# Patient Record
Sex: Female | Born: 1975 | Race: White | Hispanic: No | Marital: Married | Smoking: Current every day smoker
Health system: Southern US, Community
[De-identification: ages and names within clinical notes are randomized; demographics above are authoritative.]

## PROBLEM LIST (undated history)

## (undated) DIAGNOSIS — M797 Fibromyalgia: Secondary | ICD-10-CM

## (undated) DIAGNOSIS — M4316 Spondylolisthesis, lumbar region: Secondary | ICD-10-CM

## (undated) DIAGNOSIS — J45909 Unspecified asthma, uncomplicated: Secondary | ICD-10-CM

## (undated) DIAGNOSIS — M545 Low back pain, unspecified: Secondary | ICD-10-CM

## (undated) DIAGNOSIS — B182 Chronic viral hepatitis C: Secondary | ICD-10-CM

## (undated) DIAGNOSIS — IMO0002 Reserved for concepts with insufficient information to code with codable children: Secondary | ICD-10-CM

## (undated) HISTORY — PX: TUBAL LIGATION: SHX77

---

## 2000-10-01 ENCOUNTER — Emergency Department (HOSPITAL_COMMUNITY): Admission: EM | Admit: 2000-10-01 | Discharge: 2000-10-02 | Payer: Self-pay | Admitting: Emergency Medicine

## 2003-08-26 ENCOUNTER — Emergency Department (HOSPITAL_COMMUNITY): Admission: AD | Admit: 2003-08-26 | Discharge: 2003-08-26 | Payer: Self-pay | Admitting: Family Medicine

## 2004-05-24 ENCOUNTER — Emergency Department (HOSPITAL_COMMUNITY): Admission: EM | Admit: 2004-05-24 | Discharge: 2004-05-24 | Payer: Self-pay | Admitting: Emergency Medicine

## 2005-08-16 ENCOUNTER — Emergency Department (HOSPITAL_COMMUNITY): Admission: EM | Admit: 2005-08-16 | Discharge: 2005-08-16 | Payer: Self-pay | Admitting: Family Medicine

## 2005-11-03 ENCOUNTER — Ambulatory Visit (HOSPITAL_COMMUNITY): Admission: RE | Admit: 2005-11-03 | Discharge: 2005-11-03 | Payer: Self-pay | Admitting: Obstetrics

## 2008-08-24 ENCOUNTER — Emergency Department (HOSPITAL_COMMUNITY): Admission: EM | Admit: 2008-08-24 | Discharge: 2008-08-24 | Payer: Self-pay | Admitting: Family Medicine

## 2009-02-11 ENCOUNTER — Emergency Department (HOSPITAL_COMMUNITY): Admission: EM | Admit: 2009-02-11 | Discharge: 2009-02-11 | Payer: Self-pay | Admitting: Family Medicine

## 2009-03-08 ENCOUNTER — Emergency Department (HOSPITAL_COMMUNITY): Admission: EM | Admit: 2009-03-08 | Discharge: 2009-03-08 | Payer: Self-pay | Admitting: Family Medicine

## 2009-03-15 ENCOUNTER — Emergency Department (HOSPITAL_COMMUNITY): Admission: EM | Admit: 2009-03-15 | Discharge: 2009-03-15 | Payer: Self-pay | Admitting: Emergency Medicine

## 2009-03-17 ENCOUNTER — Encounter
Admission: RE | Admit: 2009-03-17 | Discharge: 2009-03-17 | Payer: Self-pay | Admitting: Physical Medicine and Rehabilitation

## 2009-05-13 ENCOUNTER — Emergency Department (HOSPITAL_COMMUNITY): Admission: EM | Admit: 2009-05-13 | Discharge: 2009-05-13 | Payer: Self-pay | Admitting: Emergency Medicine

## 2010-02-19 ENCOUNTER — Ambulatory Visit: Payer: Self-pay | Admitting: Gastroenterology

## 2010-04-08 ENCOUNTER — Ambulatory Visit (HOSPITAL_COMMUNITY): Admission: RE | Admit: 2010-04-08 | Discharge: 2010-04-08 | Payer: Self-pay | Admitting: Gastroenterology

## 2010-10-29 LAB — CBC
HCT: 37 % (ref 36.0–46.0)
MCHC: 34.1 g/dL (ref 30.0–36.0)
MCV: 92.3 fL (ref 78.0–100.0)
Platelets: 203 10*3/uL (ref 150–400)
RDW: 13.6 % (ref 11.5–15.5)
WBC: 9.6 10*3/uL (ref 4.0–10.5)

## 2010-10-29 LAB — PROTIME-INR
INR: 1.04 (ref 0.00–1.49)
Prothrombin Time: 13.8 seconds (ref 11.6–15.2)

## 2010-11-22 LAB — URINALYSIS, ROUTINE W REFLEX MICROSCOPIC
Glucose, UA: >1000 mg/dL — AB
Hgb urine dipstick: NEGATIVE
Leukocytes, UA: NEGATIVE
Protein, ur: NEGATIVE mg/dL
Specific Gravity, Urine: 1.028 (ref 1.005–1.030)

## 2010-11-22 LAB — POCT PREGNANCY, URINE: Preg Test, Ur: NEGATIVE

## 2011-01-01 NOTE — Op Note (Signed)
NAME:  Cathy Santos, Cathy Santos                 ACCOUNT NO.:  [id number]   MEDICAL RECORD NO.:  [id number]          PATIENT TYPE:  AMB   LOCATION:  SDC                           FACILITY:  [facility]   PHYSICIAN:  Kathreen Cosier, M.D.DATE OF BIRTH:  Apr 16, 1976   DATE OF PROCEDURE:  11/03/2005  DATE OF DISCHARGE:                                 OPERATIVE REPORT   PREOPERATIVE DIAGNOSIS:  Multiparity.   OPERATION/PROCEDURE:  Open laparoscopic tubal sterilization.   DESCRIPTION OF PROCEDURE:  Under general anesthesia, the patient in the  lithotomy position, abdomen was prepped and draped. Bladder emptied with  straight catheter.  Speculum placed in the vagina and Hulka tenaculum was  placed in the cervix.  In the umbilicus, transverse incision made and  carried down to the fascia.  The fascia was cleaned and grasped with two  Kochers.  The fascia and peritoneum were opened with Mayo scissors.  The  sleeve of the trocar inserted intraperitoneally and  3 L of carbon dioxide  infused intraperitoneally visualizing the scope inserted.  Uterus, tubes and  ovaries were normal.  Cautery probe inserted through the sleeve of the  scope.  Right tube grasped one inch from the cornua, cauterized.  The tube  was cauterized in a total of  four places moving laterally from the first  site of cautery.  This was done in the similar fashion on the other side.  Probe was removed.  CO2 was allowed to escape from the peritoneal cavity.  Fascia closed with one  suture of 0 Dexon.  Skin closed with subcuticular  stitch of 4-0 Monocryl.           ______________________________  Kathreen Cosier, M.D.     BAM/MEDQ  D:  11/03/2005  T:  11/04/2005  Job:  [id number]

## 2011-01-10 ENCOUNTER — Inpatient Hospital Stay (INDEPENDENT_AMBULATORY_CARE_PROVIDER_SITE_OTHER)
Admission: RE | Admit: 2011-01-10 | Discharge: 2011-01-10 | Disposition: A | Discharge status: Home / Self Care | Payer: Medicaid Other | Source: Ambulatory Visit | Attending: Emergency Medicine | Admitting: Emergency Medicine

## 2011-01-10 DIAGNOSIS — S335XXA Sprain of ligaments of lumbar spine, initial encounter: Secondary | ICD-10-CM

## 2011-01-10 DIAGNOSIS — J40 Bronchitis, not specified as acute or chronic: Secondary | ICD-10-CM

## 2011-01-10 LAB — POCT URINALYSIS DIP (DEVICE)
Bilirubin Urine: NEGATIVE
Nitrite: NEGATIVE
Urobilinogen, UA: 0.2 mg/dL (ref 0.0–1.0)

## 2011-02-09 ENCOUNTER — Emergency Department (HOSPITAL_COMMUNITY)
Admission: EM | Admit: 2011-02-09 | Discharge: 2011-02-09 | Disposition: A | Discharge status: Home / Self Care | Payer: Medicaid Other | Attending: Emergency Medicine | Admitting: Emergency Medicine

## 2011-02-09 DIAGNOSIS — R109 Unspecified abdominal pain: Secondary | ICD-10-CM | POA: Insufficient documentation

## 2011-02-09 DIAGNOSIS — Z8619 Personal history of other infectious and parasitic diseases: Secondary | ICD-10-CM | POA: Insufficient documentation

## 2011-02-09 DIAGNOSIS — R10816 Epigastric abdominal tenderness: Secondary | ICD-10-CM | POA: Insufficient documentation

## 2011-02-09 LAB — COMPREHENSIVE METABOLIC PANEL
Alkaline Phosphatase: 60 U/L (ref 39–117)
BUN: 15 mg/dL (ref 6–23)
CO2: 25 mEq/L (ref 19–32)
Calcium: 8.9 mg/dL (ref 8.4–10.5)
Chloride: 102 mEq/L (ref 96–112)
GFR calc Af Amer: >60 mL/min (ref >60)
GFR calc non Af Amer: >60 mL/min (ref >60)
Glucose, Bld: 100 mg/dL — ABNORMAL HIGH (ref 70–99)
Potassium: 3.3 mEq/L — ABNORMAL LOW (ref 3.5–5.1)
Sodium: 137 mEq/L (ref 135–145)
Total Bilirubin: 0.3 mg/dL (ref 0.3–1.2)
Total Protein: 6.8 g/dL (ref 6.0–8.3)

## 2011-02-09 LAB — URINALYSIS, ROUTINE W REFLEX MICROSCOPIC
Bilirubin Urine: NEGATIVE
Glucose, UA: NEGATIVE mg/dL
Hgb urine dipstick: NEGATIVE
Protein, ur: NEGATIVE mg/dL
Specific Gravity, Urine: 1.029 (ref 1.005–1.030)
pH: 5.5 (ref 5.0–8.0)

## 2011-02-09 LAB — DIFFERENTIAL
Eosinophils Relative: 1 % (ref 0–5)
Lymphocytes Relative: 31 % (ref 12–46)
Neutrophils Relative %: 62 % (ref 43–77)

## 2011-02-09 LAB — CBC
Hemoglobin: 12.4 g/dL (ref 12.0–15.0)
MCH: 32.1 pg (ref 26.0–34.0)
MCHC: 34.7 g/dL (ref 30.0–36.0)
Platelets: 259 10*3/uL (ref 150–400)
RBC: 3.86 MIL/uL — ABNORMAL LOW (ref 3.87–5.11)
RDW: 14.2 % (ref 11.5–15.5)
WBC: 13.6 10*3/uL — ABNORMAL HIGH (ref 4.0–10.5)

## 2011-06-29 ENCOUNTER — Inpatient Hospital Stay (HOSPITAL_COMMUNITY)
Admission: EM | Admit: 2011-06-29 | Discharge: 2011-07-04 | DRG: 419 | Disposition: A | Discharge status: Home / Self Care | Payer: Medicaid Other | Source: Ambulatory Visit | Attending: Internal Medicine | Admitting: Internal Medicine

## 2011-06-29 ENCOUNTER — Encounter: Payer: Self-pay | Admitting: *Deleted

## 2011-06-29 ENCOUNTER — Encounter (HOSPITAL_COMMUNITY): Payer: Self-pay | Admitting: *Deleted

## 2011-06-29 ENCOUNTER — Emergency Department (INDEPENDENT_AMBULATORY_CARE_PROVIDER_SITE_OTHER)
Admission: EM | Admit: 2011-06-29 | Discharge: 2011-06-29 | Disposition: A | Discharge status: Other Acute Inpatient Hospital | Payer: Medicaid Other | Source: Home / Self Care

## 2011-06-29 DIAGNOSIS — K8020 Calculus of gallbladder without cholecystitis without obstruction: Secondary | ICD-10-CM

## 2011-06-29 DIAGNOSIS — K8050 Calculus of bile duct without cholangitis or cholecystitis without obstruction: Secondary | ICD-10-CM

## 2011-06-29 DIAGNOSIS — F3289 Other specified depressive episodes: Secondary | ICD-10-CM | POA: Diagnosis present

## 2011-06-29 DIAGNOSIS — Z8711 Personal history of peptic ulcer disease: Secondary | ICD-10-CM

## 2011-06-29 DIAGNOSIS — M549 Dorsalgia, unspecified: Secondary | ICD-10-CM | POA: Diagnosis present

## 2011-06-29 DIAGNOSIS — F411 Generalized anxiety disorder: Secondary | ICD-10-CM | POA: Diagnosis present

## 2011-06-29 DIAGNOSIS — B1920 Unspecified viral hepatitis C without hepatic coma: Secondary | ICD-10-CM | POA: Diagnosis present

## 2011-06-29 DIAGNOSIS — B182 Chronic viral hepatitis C: Secondary | ICD-10-CM | POA: Diagnosis present

## 2011-06-29 DIAGNOSIS — K8071 Calculus of gallbladder and bile duct without cholecystitis with obstruction: Principal | ICD-10-CM | POA: Diagnosis present

## 2011-06-29 DIAGNOSIS — F329 Major depressive disorder, single episode, unspecified: Secondary | ICD-10-CM | POA: Diagnosis present

## 2011-06-29 DIAGNOSIS — F17200 Nicotine dependence, unspecified, uncomplicated: Secondary | ICD-10-CM | POA: Diagnosis present

## 2011-06-29 DIAGNOSIS — R109 Unspecified abdominal pain: Secondary | ICD-10-CM

## 2011-06-29 DIAGNOSIS — G8929 Other chronic pain: Secondary | ICD-10-CM | POA: Diagnosis present

## 2011-06-29 HISTORY — DX: Reserved for concepts with insufficient information to code with codable children: IMO0002

## 2011-06-29 HISTORY — DX: Chronic viral hepatitis C: B18.2

## 2011-06-29 LAB — DIFFERENTIAL
Basophils Absolute: 0 10*3/uL (ref 0.0–0.1)
Eosinophils Absolute: 0.1 10*3/uL (ref 0.0–0.7)
Lymphs Abs: 2.2 10*3/uL (ref 0.7–4.0)
Monocytes Absolute: 0.7 10*3/uL (ref 0.1–1.0)
Monocytes Relative: 7 % (ref 3–12)
Neutrophils Relative %: 69 % (ref 43–77)

## 2011-06-29 LAB — CBC
HCT: 35.8 % — ABNORMAL LOW (ref 36.0–46.0)
MCH: 31.6 pg (ref 26.0–34.0)
MCHC: 34.4 g/dL (ref 30.0–36.0)
RBC: 3.89 MIL/uL (ref 3.87–5.11)
RDW: 14 % (ref 11.5–15.5)

## 2011-06-29 LAB — COMPREHENSIVE METABOLIC PANEL
ALT: 241 U/L — ABNORMAL HIGH (ref 0–35)
Alkaline Phosphatase: 112 U/L (ref 39–117)
GFR calc non Af Amer: >90 mL/min (ref >90)
Sodium: 139 mEq/L (ref 135–145)

## 2011-06-29 LAB — LIPASE, BLOOD: Lipase: 30 U/L (ref 11–59)

## 2011-06-29 NOTE — ED Notes (Signed)
Pt  Seen  By  Her  Pcp  Yesterday      She  Reports  abd  Pain   Radiating  To back       Pt Has  History  Of  Ulcers  As  Well

## 2011-06-29 NOTE — ED Notes (Signed)
Here from [facility], c/o epigastric abd pain, h/o same, h/o ulcers and hep C, also nv, last emesis yesterday x1, onset yesterday morning. Recently switched from omeprazole to nexium (previous not working). "GI cocktails in the past and an injection has helped", still has gallbladder.

## 2011-06-29 NOTE — ED Notes (Signed)
Pt  Reports  abd  Pain  Which  Started  Yesterday  Pain     Nausea vomited   This  Am            Pt  t  Reports  Has  A  Bleeding  Ulcer

## 2011-06-29 NOTE — ED Provider Notes (Signed)
History     CSN: [id number] Arrival date & time: 06/29/2011  9:04 PM   None     Chief Complaint  Patient presents with  . Abdominal Pain    (Consider location/radiation/quality/duration/timing/severity/associated sxs/prior treatment) HPI Comments: Pt states she has been diagnosed with stomach ulcers and was started on Omeprazole. Symptoms were not improving and recently was switched to Nexium. Yesterday pain suddenly significantly worsened. Pain radiates to back. Vomited 1 x this morning. No hematemesis. BMs normal and denies rectal bleeding or melena. No fever or chills. Has not tried anything for her pain.   Patient is a 35 y.o. female presenting with abdominal pain. The history is provided by the patient.  Abdominal Pain The primary symptoms of the illness include abdominal pain and vomiting. The primary symptoms of the illness do not include fever, shortness of breath, nausea, diarrhea or hematemesis. The problem has been rapidly worsening.  The abdominal pain is located in the epigastric region. The abdominal pain radiates to the back. The severity of the abdominal pain is 10/10. The abdominal pain is relieved by nothing.  The vomiting began today. Vomiting occurred once. The emesis contains stomach contents.  The patient states that she believes she is currently not pregnant. The patient has not had a change in bowel habit. Symptoms associated with the illness do not include chills or constipation. Significant associated medical issues include PUD and liver disease.    Past Medical History  Diagnosis Date  . Ulcer   . Hep C w/o coma, chronic     Past Surgical History  Procedure Date  . Tubal ligation     No family history on file.  History  Substance Use Topics  . Smoking status: Current Everyday Smoker  . Smokeless tobacco: Not on file  . Alcohol Use: No    OB History    Grav Para Term Preterm Abortions TAB SAB Ect Mult Living                  Review of  Systems  Constitutional: Negative for fever and chills.  Respiratory: Negative for cough, chest tightness and shortness of breath.   Cardiovascular: Negative for chest pain and palpitations.  Gastrointestinal: Positive for vomiting and abdominal pain. Negative for nausea, diarrhea, constipation, blood in stool, abdominal distention and hematemesis.    Allergies  Amoxicillin  Home Medications   Current Outpatient Rx  Name Route Sig Dispense Refill  . CLONAZEPAM 0.5 MG PO TABS Oral Take 0.5 mg by mouth 2 (two) times daily as needed.      Marland Kitchen ESOMEPRAZOLE MAGNESIUM 40 MG PO CPDR Oral Take 40 mg by mouth daily before breakfast.        BP 119/83  Pulse 81  Temp(Src) 97.6 F (36.4 C) (Oral)  Resp 20  SpO2 99%  LMP 06/22/2011  Physical Exam  Nursing note and vitals reviewed. Constitutional: She appears well-developed and well-nourished.       Uncomfortable appearing, tearful.  HENT:  Head: Normocephalic and atraumatic.  Right Ear: Tympanic membrane, external ear and ear canal normal.  Left Ear: Tympanic membrane, external ear and ear canal normal.  Nose: Nose normal.  Mouth/Throat: Uvula is midline, oropharynx is clear and moist and mucous membranes are normal. No oropharyngeal exudate, posterior oropharyngeal edema or posterior oropharyngeal erythema.  Neck: Neck supple.  Cardiovascular: Normal rate, regular rhythm and normal heart sounds.   Pulmonary/Chest: Effort normal and breath sounds normal. No respiratory distress.  Abdominal: Normal appearance and bowel sounds  are normal. She exhibits no mass. There is hepatosplenomegaly. There is tenderness in the right upper quadrant, epigastric area and left upper quadrant. There is guarding (RUQ & epigastrum). There is no rigidity and no rebound.    Lymphadenopathy:    She has no cervical adenopathy.  Neurological: She is alert.  Skin: Skin is warm and dry.    ED Course  Procedures (including critical care time)  Labs Reviewed -  No data to display No results found.   1. Abdominal pain       MDM  Pt transferred to [facility].       Melody Comas, [place] 06/29/11 2128

## 2011-06-29 NOTE — Discharge Instructions (Signed)
To ED

## 2011-06-30 ENCOUNTER — Emergency Department (HOSPITAL_COMMUNITY): Payer: Medicaid Other

## 2011-06-30 ENCOUNTER — Encounter (HOSPITAL_COMMUNITY): Payer: Self-pay | Admitting: *Deleted

## 2011-06-30 DIAGNOSIS — R932 Abnormal findings on diagnostic imaging of liver and biliary tract: Secondary | ICD-10-CM

## 2011-06-30 DIAGNOSIS — B1920 Unspecified viral hepatitis C without hepatic coma: Secondary | ICD-10-CM | POA: Diagnosis present

## 2011-06-30 DIAGNOSIS — K8071 Calculus of gallbladder and bile duct without cholecystitis with obstruction: Secondary | ICD-10-CM | POA: Diagnosis present

## 2011-06-30 DIAGNOSIS — K8020 Calculus of gallbladder without cholecystitis without obstruction: Secondary | ICD-10-CM

## 2011-06-30 DIAGNOSIS — K8010 Calculus of gallbladder with chronic cholecystitis without obstruction: Secondary | ICD-10-CM

## 2011-06-30 DIAGNOSIS — R109 Unspecified abdominal pain: Secondary | ICD-10-CM

## 2011-06-30 DIAGNOSIS — M549 Dorsalgia, unspecified: Secondary | ICD-10-CM | POA: Diagnosis present

## 2011-06-30 LAB — URINE MICROSCOPIC-ADD ON

## 2011-06-30 LAB — RAPID URINE DRUG SCREEN, HOSP PERFORMED
Amphetamines: NOT DETECTED
Benzodiazepines: NOT DETECTED
Cocaine: NOT DETECTED
Opiates: NOT DETECTED

## 2011-06-30 LAB — URINALYSIS, ROUTINE W REFLEX MICROSCOPIC
Glucose, UA: NEGATIVE mg/dL
Ketones, ur: >80 mg/dL
Leukocytes, UA: NEGATIVE
Nitrite: NEGATIVE
Specific Gravity, Urine: 1.015 (ref 1.005–1.030)
pH: 7.5 (ref 5.0–8.0)

## 2011-06-30 LAB — PROTIME-INR: INR: 1.09 (ref 0.00–1.49)

## 2011-06-30 MED ORDER — HEPARIN SODIUM (PORCINE) 5000 UNIT/ML IJ SOLN
5000.0000 [IU] | Freq: Three times a day (TID) | INTRAMUSCULAR | Status: DC
  Administered 2011-07-02 – 2011-07-04 (×5): 5000 [IU] via SUBCUTANEOUS
  Filled 2011-06-30 (×11): qty 1

## 2011-06-30 MED ORDER — SODIUM CHLORIDE 0.9 % IV SOLN: INTRAVENOUS | Status: DC

## 2011-06-30 MED ORDER — CLONAZEPAM 0.5 MG PO TABS
0.5000 mg | ORAL_TABLET | Freq: Two times a day (BID) | ORAL | Status: DC | PRN
  Administered 2011-06-30 – 2011-07-03 (×5): 0.5 mg via ORAL
  Filled 2011-06-30 (×5): qty 1

## 2011-06-30 MED ORDER — DIPHENHYDRAMINE HCL 50 MG/ML IJ SOLN
25.0000 mg | Freq: Once | INTRAMUSCULAR | Status: AC
  Administered 2011-06-30: 25 mg via INTRAVENOUS
  Filled 2011-06-30: qty 1

## 2011-06-30 MED ORDER — PNEUMOCOCCAL VAC POLYVALENT 25 MCG/0.5ML IJ INJ
0.5000 mL | INJECTION | INTRAMUSCULAR | Status: DC
  Filled 2011-06-30: qty 0.5

## 2011-06-30 MED ORDER — ONDANSETRON HCL 4 MG/2ML IJ SOLN: 4.0000 mg | INTRAMUSCULAR | Status: DC | PRN

## 2011-06-30 MED ORDER — ONDANSETRON HCL 4 MG/2ML IJ SOLN: 4.0000 mg | Freq: Three times a day (TID) | INTRAMUSCULAR | Status: DC | PRN

## 2011-06-30 MED ORDER — INFLUENZA VIRUS VACC SPLIT PF IM SUSP
0.5000 mL | INTRAMUSCULAR | Status: DC
  Filled 2011-06-30: qty 0.5

## 2011-06-30 MED ORDER — FENTANYL CITRATE 0.05 MG/ML IJ SOLN
50.0000 ug | Freq: Once | INTRAMUSCULAR | Status: AC
  Administered 2011-06-30: 50 ug via INTRAVENOUS
  Filled 2011-06-30: qty 2

## 2011-06-30 MED ORDER — OXYCODONE-ACETAMINOPHEN 5-500 MG PO TABS: 1.0000 | ORAL_TABLET | ORAL | Status: DC | PRN

## 2011-06-30 MED ORDER — NICOTINE 21 MG/24HR TD PT24
21.0000 mg | MEDICATED_PATCH | Freq: Every day | TRANSDERMAL | Status: DC
  Administered 2011-06-30 – 2011-07-04 (×5): 21 mg via TRANSDERMAL
  Filled 2011-06-30 (×6): qty 1

## 2011-06-30 MED ORDER — PANTOPRAZOLE SODIUM 40 MG IV SOLR
40.0000 mg | Freq: Once | INTRAVENOUS | Status: AC
  Filled 2011-06-30: qty 40

## 2011-06-30 MED ORDER — IOHEXOL 300 MG/ML  SOLN
80.0000 mL | Freq: Once | INTRAMUSCULAR | Status: AC | PRN
  Administered 2011-06-30: 80 mL via INTRAVENOUS

## 2011-06-30 MED ORDER — PANTOPRAZOLE SODIUM 40 MG IV SOLR
40.0000 mg | Freq: Once | INTRAVENOUS | Status: AC
  Administered 2011-06-30: 40 mg via INTRAVENOUS

## 2011-06-30 MED ORDER — ONDANSETRON HCL 4 MG/2ML IJ SOLN: 4.0000 mg | Freq: Four times a day (QID) | INTRAMUSCULAR | Status: DC | PRN

## 2011-06-30 MED ORDER — ONDANSETRON HCL 4 MG/2ML IJ SOLN
INTRAMUSCULAR | Status: AC
  Filled 2011-06-30: qty 2

## 2011-06-30 MED ORDER — FENTANYL CITRATE 0.05 MG/ML IJ SOLN: 50.0000 ug | INTRAMUSCULAR | Status: DC | PRN

## 2011-06-30 MED ORDER — PANTOPRAZOLE SODIUM 40 MG IV SOLR
40.0000 mg | Freq: Every day | INTRAVENOUS | Status: DC
  Administered 2011-06-30 – 2011-07-02 (×3): 40 mg via INTRAVENOUS
  Filled 2011-06-30 (×5): qty 40

## 2011-06-30 MED ORDER — MORPHINE SULFATE 2 MG/ML IJ SOLN
1.0000 mg | INTRAMUSCULAR | Status: DC | PRN
  Administered 2011-06-30 – 2011-07-02 (×13): 1 mg via INTRAVENOUS
  Filled 2011-06-30 (×12): qty 1

## 2011-06-30 MED ORDER — HEPARIN SODIUM (PORCINE) 5000 UNIT/ML IJ SOLN
5000.0000 [IU] | Freq: Three times a day (TID) | INTRAMUSCULAR | Status: AC
  Administered 2011-06-30 (×2): 5000 [IU] via SUBCUTANEOUS
  Filled 2011-06-30: qty 1

## 2011-06-30 MED ORDER — SODIUM CHLORIDE 0.9 % IV BOLUS (SEPSIS)
1000.0000 mL | Freq: Once | INTRAVENOUS | Status: AC
  Administered 2011-06-30: 1000 mL via INTRAVENOUS

## 2011-06-30 MED ORDER — CIPROFLOXACIN IN D5W 400 MG/200ML IV SOLN
400.0000 mg | Freq: Two times a day (BID) | INTRAVENOUS | Status: DC
  Administered 2011-06-30 – 2011-07-04 (×9): 400 mg via INTRAVENOUS
  Filled 2011-06-30 (×10): qty 200

## 2011-06-30 MED ORDER — OXYCODONE-ACETAMINOPHEN 5-325 MG PO TABS
1.0000 | ORAL_TABLET | ORAL | Status: DC | PRN
  Administered 2011-06-30 – 2011-07-02 (×4): 1 via ORAL
  Filled 2011-06-30 (×4): qty 1

## 2011-06-30 MED ORDER — ONDANSETRON HCL 4 MG/2ML IJ SOLN
4.0000 mg | INTRAMUSCULAR | Status: DC | PRN
  Administered 2011-06-30: 4 mg via INTRAVENOUS

## 2011-06-30 MED ORDER — SODIUM CHLORIDE 0.9 % IV SOLN
INTRAVENOUS | Status: DC
  Administered 2011-07-01 – 2011-07-03 (×5): via INTRAVENOUS

## 2011-06-30 MED ORDER — SODIUM CHLORIDE 0.9 % IV SOLN
INTRAVENOUS | Status: DC
  Administered 2011-06-30 (×3): via INTRAVENOUS

## 2011-06-30 NOTE — ED Notes (Signed)
Introduced self to patient, nad noted, abc intact, complains of pain. sts fentanyl offered some relief but still is in pain. Denies any other needs at this time time.

## 2011-06-30 NOTE — Consult Note (Signed)
[facility] Consultation  Referring Provider: [facility] Primary Care Physician:  Lonia Blood, MD Primary Gastroenterologist:   None Reason for Consultation:  Abdominal pain, dilated bile ducts  HPI: Cathy Santos is a 35 y.o. female who presented to ER with epigastric pain and nausea. She had same pain in late June, went to [facility] ER where LFTs, lipase, CBC all normal. Patient released home with Phenergan and PPI. Patient was started on a PPI and really hasn't been bothered by the pain since until yesterday. Pain is epigastric with radiation through to the back. It is constant in nature.   Patient has history of HCV. Her PCP referred her to Dr. Brooke Dare and she underwent liver biopsy in August 2011.    Past Medical History  Diagnosis Date  . Hep C w/o coma, chronic  Chronic back pain     Past Surgical History  Procedure Date  . Tubal ligation     Prior to Admission medications   Medication Sig Start Date End Date Taking? Authorizing Provider  clonazePAM (KLONOPIN) 0.5 MG tablet Take 0.5 mg by mouth 2 (two) times daily as needed. For anxiety.   Yes Historical Provider, MD  esomeprazole (NEXIUM) 40 MG capsule Take 40 mg by mouth daily before breakfast.     Yes Historical Provider, MD  oxycodone-acetaminophen (ROXICET) 5-500 MG per tablet Take 1 tablet by mouth every 4 (four) hours as needed. For pain.    Yes Historical Provider, MD    Current Facility-Administered Medications  Medication Dose Route Frequency Provider Last Rate Last Dose  . 0.9 %  sodium chloride infusion   Intravenous Continuous Ward Givens, MD 125 mL/hr at 06/30/11 1029    . ciprofloxacin (CIPRO) IVPB 400 mg  400 mg Intravenous Q12H Hind I. Elsaid   400 mg at 06/30/11 1032  . diphenhydrAMINE (BENADRYL) injection 25 mg  25 mg Intravenous Once Ward Givens, MD   25 mg at 06/30/11 0229  . fentaNYL (SUBLIMAZE) injection 50 mcg  50 mcg Intravenous Once Ward Givens, MD   50 mcg at  06/30/11 0230  . fentaNYL (SUBLIMAZE) injection 50 mcg  50 mcg Intravenous Once Ward Givens, MD   50 mcg at 06/30/11 0642  . heparin injection 5,000 Units  5,000 Units Subcutaneous Q8H Hind I. Elsaid   5,000 Units at 06/30/11 1042  . morphine 2 MG/ML injection 1 mg  1 mg Intravenous Q3H PRN Hind I. Elsaid   1 mg at 06/30/11 0951  . ondansetron (ZOFRAN) injection 4 mg  4 mg Intravenous Q1H PRN Ward Givens, MD      . pantoprazole (PROTONIX) injection 40 mg  40 mg Intravenous Once Ward Givens, MD      . pantoprazole (PROTONIX) injection 40 mg  40 mg Intravenous Once Ward Givens, MD   40 mg at 06/30/11 0231  . sodium chloride 0.9 % bolus 1,000 mL  1,000 mL Intravenous Once Ward Givens, MD   1,000 mL at 06/30/11 0200  . DISCONTD: 0.9 %  sodium chloride infusion   Intravenous Continuous Ward Givens, MD      . DISCONTD: fentaNYL (SUBLIMAZE) injection 50 mcg  50 mcg Intravenous Q1H PRN Ward Givens, MD      . DISCONTD: ondansetron (ZOFRAN) injection 4 mg  4 mg Intravenous Q1H PRN Ward Givens, MD   4 mg at 06/30/11 0231   Current Outpatient Prescriptions  Medication Sig Dispense Refill  . clonazePAM (KLONOPIN)  0.5 MG tablet Take 0.5 mg by mouth 2 (two) times daily as needed. For anxiety.      Marland Kitchen esomeprazole (NEXIUM) 40 MG capsule Take 40 mg by mouth daily before breakfast.        . oxycodone-acetaminophen (ROXICET) 5-500 MG per tablet Take 1 tablet by mouth every 4 (four) hours as needed. For pain.         Allergies as of 06/29/2011 - Review Complete 06/29/2011  Allergen Reaction Noted  . Amoxicillin  06/29/2011    Family History  Problem Relation Age of Onset  . Cancer Mother   . Diabetes Father   . Heart failure Father   . Hypertension Father   . Stroke Father     History   Social History  . Marital Status: Married    Spouse Name: N/A    Number of Children: N/A  . Years of Education: N/A   Occupational History  . Not on file.   Social History Main Topics  . Smoking status:  Current Everyday Smoker  . Smokeless tobacco: Not on file  . Alcohol Use: No  . Drug Use: No  . Sexually Active:    Other Topics Concern  . Not on file   Social History Narrative  . No narrative on file    Review of Systems: Positive for chronic back pain. Otherwise, 11 systems reviewed and negative except where noted in HPI.   Lab Results:  [facility] 06/29/11 2156  WBC 9.8  HGB 12.3  HCT 35.8*  PLT 258   BMET  Basename 06/29/11 2156  NA 139  K 3.5  CL 105  CO2 24  GLUCOSE 90  BUN 8  CREATININE 0.67  CALCIUM 9.1   LFT  Basename 06/29/11 2156  PROT 6.7  ALBUMIN 3.9  AST 153*  ALT 241*  ALKPHOS 112  BILITOT 2.5*  BILIDIR --  IBILI --    Studies/Results: US Abdomen Complete  06/30/2011  *RADIOLOGY REPORT*  Clinical Data:  Abdominal pain; increased LFTs.  ABDOMINAL ULTRASOUND COMPLETE  Comparison:  Abdominal ultrasound performed 04/08/2010  Findings:  Gallbladder: The gallbladder is somewhat contracted, with multiple small stones.  There is relatively diffuse wall thickening along the gallbladder, to 0.4 cm.  No ultrasonographic Murphy's sign is elicited, though the patient is partially sedated.  This could simply reflect a contracted gallbladder, though cholecystitis cannot be entirely excluded.  Common Bile Duct:  1.0 cm in diameter; dilated to the level of the pancreatic head, raising concern for distal obstruction.  Liver:  Grossly normal parenchymal echogenicity and echotexture; no focal lesions identified.  Mild nodularity to the hepatic contour could reflect mild cirrhotic change.  Limited Doppler evaluation demonstrates normal blood flow within the liver. Intrahepatic biliary ductal dilatation is noted; this may explain the patient's elevated LFTs.  IVC:  Unremarkable in appearance.  Pancreas:  Although the pancreas is difficult to visualize in its entirety due to overlying bowel gas, no focal pancreatic abnormality is identified.  Spleen:  10.1 cm in length;  within normal limits in size and echotexture.  Right kidney:  12.2 cm in length; normal in size, configuration and parenchymal echogenicity.  No evidence of mass or hydronephrosis.  Left kidney:  12.8 cm in length; normal in size, configuration and parenchymal echogenicity.  No evidence of mass or hydronephrosis.  Abdominal Aorta:  Normal in caliber; no aneurysm identified.  IMPRESSION:  1.  Dilatation of the intrahepatic biliary ducts and common hepatic duct; the common hepatic duct measures  1.0 cm in diameter.  This raises concern for distal obstruction. 2.  Somewhat contracted gallbladder, with multiple small stones. Relatively diffuse gallbladder wall thickening may simply reflect a contracted gallbladder, though cholecystitis cannot be entirely excluded. 3.  Mild nodularity to the hepatic contour could reflect mild cirrhotic change; liver otherwise unremarkable in appearance.  Original Report Authenticated By: Tonia Ghent, M.D.     Previous Endoscopies: None  Impression / Plan: 1.  Cholelithiasis / ?choledocholithiasis - 35 year old female with second episode of epigastric pain and nausea since late June. Now with elevated LFTs, dilatated intrahepatic and common hepatic duct (1.0 cm). CBD dilated to 1.0 cm to level of pancreatic head. Surgery has seen patient, she will need cholecystectomy this admission. She will probably need pre-op ERCP as well. Given chronic narcotic use patient will need to be done with Propofol. I have explained the ERCP and its potential risks/complications with the patient and she agrees to proceed. Will most likely be done tomorrow by Dr. Leone Payor.  2. HCV, untreated. Followed by Dr. Elfredia Nevins. Liver biopsy in August. 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 Fibrosis 1/6.  3. Chronic back pain / chronic narcotic use.  4. Anxiety, takes anxiolytic as needed.    LOS: 1 day   Willette Cluster  06/30/2011, 12:09 PM

## 2011-06-30 NOTE — ED Provider Notes (Signed)
History     CSN: [id number] Arrival date & time: 06/29/2011  9:35 PM   First MD Initiated Contact with Patient 06/30/11 0100      Chief Complaint  Patient presents with  . Abdominal Pain    (Consider location/radiation/quality/duration/timing/severity/associated sxs/prior Treatment)  HPI  Patient relates she's been having some epigastric abdominal pain for the past several months. She has not had a GI evaluation or any other tests done other than blood work. She relates she was on Prilosec but was started on Nexium by her primary care physician Dr.Garba  a couple days ago. She relates she started having epigastric pain on November 12 that is sharp and sometimes radiates into her back. She has had nausea without vomiting or diarrhea. She states eating food especially spicy or greasy food makes it worse. She states GI cocktails have helped in the past.  Primary care Dr. Mikeal Hawthorne  Past Medical History  Diagnosis Date  . Ulcer   . Hep C w/o coma, chronic   Depression Anxiety Chronic back pain  Past Surgical History  Procedure Date  . Tubal ligation     Family History  Problem Relation Age of Onset  . Cancer Mother   . Diabetes Father   . Heart failure Father   . Hypertension Father   . Stroke Father     History  Substance Use Topics  . Smoking status: Current Everyday Smoker  . Smokeless tobacco: Not on file  . Alcohol Use: No   patient's on disability for mental illness  OB History    Grav Para Term Preterm Abortions TAB SAB Ect Mult Living                  Review of Systems  All other systems reviewed and are negative.    Allergies  Amoxicillin  Home Medications   Current Outpatient Rx  Name Route Sig Dispense Refill  . CLONAZEPAM 0.5 MG PO TABS Oral Take 0.5 mg by mouth 2 (two) times daily as needed. For anxiety.    . ESOMEPRAZOLE MAGNESIUM 40 MG PO CPDR Oral Take 40 mg by mouth daily before breakfast.      . OXYCODONE-ACETAMINOPHEN 5-500 MG PO TABS  Oral Take 1 tablet by mouth every 4 (four) hours as needed. For pain.       BP 117/74  Pulse 66  Temp(Src) 97.5 F (36.4 C) (Oral)  SpO2 98%  LMP 06/22/2011 Vital signs normal  Physical Exam  Constitutional: She is oriented to person, place, and time. She appears well-developed and well-nourished.  Non-toxic appearance. She does not appear ill. No distress.  HENT:  Head: Normocephalic and atraumatic.  Right Ear: External ear normal.  Left Ear: External ear normal.  Nose: Nose normal. No mucosal edema or rhinorrhea.  Mouth/Throat: Uvula is midline. Mucous membranes are dry. No dental abscesses or uvula swelling. No oropharyngeal exudate, posterior oropharyngeal edema, posterior oropharyngeal erythema or tonsillar abscesses.       Mucous membranes are dry. Patient is missing all of her upper incisors  Eyes: Conjunctivae and EOM are normal. Pupils are equal, round, and reactive to light.  Neck: Normal range of motion and full passive range of motion without pain. Neck supple.  Cardiovascular: Normal rate, regular rhythm and normal heart sounds.  Exam reveals no gallop and no friction rub.   No murmur heard. Pulmonary/Chest: Effort normal and breath sounds normal. No respiratory distress. She has no wheezes. She has no rhonchi. She has no rales. She  exhibits no tenderness and no crepitus.  Abdominal: Soft. Normal appearance and bowel sounds are normal. She exhibits no distension. There is tenderness in the epigastric area. There is no rebound and no guarding.  Musculoskeletal: Normal range of motion. She exhibits no edema and no tenderness.       Moves all extremities well.   Neurological: She is alert and oriented to person, place, and time. She has normal strength. No cranial nerve deficit.  Skin: Skin is warm, dry and intact. No rash noted. No erythema. No pallor.  Psychiatric: Her speech is normal and behavior is normal. Her mood appears not anxious.       Affect is flat    ED  Course  Procedures (including critical care time)  Patient received IV fluids and IV pain and nausea medicine  05:25 I spoke to radiologist, [place] not showing up on [facility], he states he doesn't have it either, he is going to contact us tech to see why it hasn't crossed over into the system.   05:50 Radiologist called back, still getting images but states preliminary impression her gallbladder is contracted, wall thickened, has stones and dilated CBD of 8 mm suggestive of distal stone, but not visualized, will finish official read soon.  06:06 Radiologist called back, states she has dilitation that is intrahepatic suggestive of a CBD stone. ? Murphy's sign 06:30 Patient requesting more pain medications, fentanyl repeated.   07:15 Dr Rhea Belton, asks to have medicine admit, they will consult for ERCP. No antibiotics at this time.   07:43 Dr Eda Paschal, admit to Team 9, med-surg bed    Medications  oxycodone-acetaminophen (ROXICET) 5-500 MG per tablet (not administered)  0.9 %  sodium chloride infusion (  Intravenous New Bag 06/30/11 0643)  ondansetron (ZOFRAN) injection 4 mg (4 mg Intravenous Given 06/30/11 0231)  sodium chloride 0.9 % bolus 1,000 mL (1000 mL Intravenous Given 06/30/11 0200)  fentaNYL (SUBLIMAZE) injection 50 mcg (50 mcg Intravenous Given 06/30/11 0230)  diphenhydrAMINE (BENADRYL) injection 25 mg (25 mg Intravenous Given 06/30/11 0229)  pantoprazole (PROTONIX) injection 40 mg (0 mg Intravenous Duplicate 06/30/11 0224)  pantoprazole (PROTONIX) injection 40 mg (40 mg Intravenous Given 06/30/11 0231)  fentaNYL (SUBLIMAZE) injection 50 mcg (50 mcg Intravenous Given 06/30/11 7829)     Results for orders placed during the hospital encounter of 06/29/11  COMPREHENSIVE METABOLIC PANEL      Component Value Range   Sodium 139  135 - 145 (mEq/L)   Potassium 3.5  3.5 - 5.1 (mEq/L)   Chloride 105  96 - 112 (mEq/L)   CO2 24  19 - 32 (mEq/L)   Glucose, Bld 90  70 - 99 (mg/dL)   BUN 8  6 -  23 (mg/dL)   Creatinine, Ser 5.62  0.50 - 1.10 (mg/dL)   Calcium 9.1  8.4 - 13.0 (mg/dL)   Total Protein 6.7  6.0 - 8.3 (g/dL)   Albumin 3.9  3.5 - 5.2 (g/dL)   AST 865 (*) 0 - 37 (U/L)   ALT 241 (*) 0 - 35 (U/L)   Alkaline Phosphatase 112  39 - 117 (U/L)   Total Bilirubin 2.5 (*) 0.3 - 1.2 (mg/dL)   GFR calc non Af Amer >90  >90 (mL/min)   GFR calc Af Amer >90  >90 (mL/min)  CBC      Component Value Range   WBC 9.8  4.0 - 10.5 (K/uL)   RBC 3.89  3.87 - 5.11 (MIL/uL)   Hemoglobin 12.3  12.0 -  15.0 (g/dL)   HCT 16.1 (*) 09.6 - 46.0 (%)   MCV 92.0  78.0 - 100.0 (fL)   MCH 31.6  26.0 - 34.0 (pg)   MCHC 34.4  30.0 - 36.0 (g/dL)   RDW 04.5  40.9 - 81.1 (%)   Platelets 258  150 - 400 (K/uL)  DIFFERENTIAL      Component Value Range   Neutrophils Relative 69  43 - 77 (%)   Neutro Abs 6.8  1.7 - 7.7 (K/uL)   Lymphocytes Relative 23  12 - 46 (%)   Lymphs Abs 2.2  0.7 - 4.0 (K/uL)   Monocytes Relative 7  3 - 12 (%)   Monocytes Absolute 0.7  0.1 - 1.0 (K/uL)   Eosinophils Relative 1  0 - 5 (%)   Eosinophils Absolute 0.1  0.0 - 0.7 (K/uL)   Basophils Relative 0  0 - 1 (%)   Basophils Absolute 0.0  0.0 - 0.1 (K/uL)  LIPASE, BLOOD      Component Value Range   Lipase 30  11 - 59 (U/L)    Laboratory interpretation elevation of the LFTs which were normal on June 26 of this year. Reviewing patient's prior radiology studies she had a liver biopsy done in August 2010  US Abdomen Complete  06/30/2011  *RADIOLOGY REPORT*  Clinical Data:  Abdominal pain; increased LFTs.  ABDOMINAL ULTRASOUND COMPLETE  Comparison:  Abdominal ultrasound performed 04/08/2010  Findings:  Gallbladder: The gallbladder is somewhat contracted, with multiple small stones.  There is relatively diffuse wall thickening along the gallbladder, to 0.4 cm.  No ultrasonographic Murphy's sign is elicited, though the patient is partially sedated.  This could simply reflect a contracted gallbladder, though cholecystitis cannot be  entirely excluded.  Common Bile Duct:  1.0 cm in diameter; dilated to the level of the pancreatic head, raising concern for distal obstruction.  Liver:  Grossly normal parenchymal echogenicity and echotexture; no focal lesions identified.  Mild nodularity to the hepatic contour could reflect mild cirrhotic change.  Limited Doppler evaluation demonstrates normal blood flow within the liver. Intrahepatic biliary ductal dilatation is noted; this may explain the patient's elevated LFTs.  IVC:  Unremarkable in appearance.  Pancreas:  Although the pancreas is difficult to visualize in its entirety due to overlying bowel gas, no focal pancreatic abnormality is identified.  Spleen:  10.1 cm in length; within normal limits in size and echotexture.  Right kidney:  12.2 cm in length; normal in size, configuration and parenchymal echogenicity.  No evidence of mass or hydronephrosis.  Left kidney:  12.8 cm in length; normal in size, configuration and parenchymal echogenicity.  No evidence of mass or hydronephrosis.  Abdominal Aorta:  Normal in caliber; no aneurysm identified.  IMPRESSION:  1.  Dilatation of the intrahepatic biliary ducts and common hepatic duct; the common hepatic duct measures 1.0 cm in diameter.  This raises concern for distal obstruction. 2.  Somewhat contracted gallbladder, with multiple small stones. Relatively diffuse gallbladder wall thickening may simply reflect a contracted gallbladder, though cholecystitis cannot be entirely excluded. 3.  Mild nodularity to the hepatic contour could reflect mild cirrhotic change; liver otherwise unremarkable in appearance.  Original Report Authenticated By: Tonia Ghent, M.D.     Diagnoses that have been ruled out:  Diagnoses that are still under consideration:  Final diagnoses:  Gallstones  Common bile duct stone  Abdominal pain    Plan admit   Devoria Albe, MD, Armando Gang   MDM  Ward Givens, MD 06/30/11 [phone]

## 2011-06-30 NOTE — ED Notes (Signed)
Ultrasound at bedside

## 2011-06-30 NOTE — ED Notes (Signed)
Patient is resting comfortably. 

## 2011-06-30 NOTE — ED Notes (Signed)
Pt upset about the amount of time that she has been waiting.  However, when EDP went to talk with pt, pt was sleeping and unable to be woken up with name calling.  No distress noted, pt resting, breathing nonlabored.

## 2011-06-30 NOTE — Consult Note (Signed)
Agree with Ms. Wilmon Pali.  This patient has a dilated biliary tree and cholelithiasis. Plan for ERCP to evaluate biliary dilation with likely sphincterotomy and stone removal.  The risks and benefits as well as alternatives of endoscopic procedure(s) have been discussed and reviewed. All questions answered. The patient agrees to proceed.

## 2011-06-30 NOTE — ED Provider Notes (Signed)
Medical screening examination/treatment/procedure(s) were performed by non-physician practitioner and as supervising physician I was immediately available for consultation/collaboration.  Luiz Blare MD   Danella Maiers Kaylaann Mountz 06/30/11 1423

## 2011-06-30 NOTE — Consult Note (Signed)
I have seen and examined the patient and agree with the assessment and plans  

## 2011-06-30 NOTE — Consult Note (Signed)
Reason for Consult: Cholelithiasis and abd pain Referring Physician: Eda Paschal   HPI: Cathy Santos is an 35 y.o. female who began having abd pain yesterday morning. She ate pizza the night before. She has no known hx of gb disease, but does have Hep C. She states she had a similar episode a few months ago and was seen at [facility] and was told she may have some gastritis or ulcer, but she had no imaging. She takes antacids and Nexium but it has not helped with this episode. She reports the pain is in her epigastrum and radiates through to her back. She does report some RUQ too. No N/V, tried to eat a little yesterday morning but no appetite. Bowels moving normal. Denies CP, SOB, dysuria. She presents to the ER where her workup has found evidence of gallstones, dilated CBD and hepatic ducts, and elevated LFTs. Surgery consult requested.  Past Medical History:  Past Medical History  Diagnosis Date  . Ulcer   . Hep C w/o coma, chronic     Surgical History:  Past Surgical History  Procedure Date  . Tubal ligation     Family History:  Family History  Problem Relation Age of Onset  . Cancer Mother   . Diabetes Father   . Heart failure Father   . Hypertension Father   . Stroke Father     Social History:  reports that she has been smoking about a pack a day. No smokeless tobacco history on file. She reports that she does not drink alcohol or use illicit drugs but UDS is positive for Cannibus.  Allergies:  Allergies  Allergen Reactions  . Amoxicillin     Medications: Prior to Admission: See PTA lsit   ROS: See HPI for pertinent findings, otherwise complete 10 system review negative.  Physical Exam: Blood pressure 95/57, pulse 57, temperature 97.7 F (36.5 C), temperature source Oral, resp. rate 16, last menstrual period 06/22/2011, SpO2 100.00%.  General Appearance:  Alert, cooperative, no distress, appears stated age  Head:  Normocephalic, without obvious abnormality, atraumatic    ENT: Unremarkable except poor dental hygeine  Neck: Supple, symmetrical, trachea midline, no adenopathy, thyroid: not enlarged, symmetric, no tenderness/mass/nodules  Lungs:   Clear to auscultation bilaterally, no w/r/r, respirations unlabored without use of accessory muscles.  Chest Wall:  No tenderness or deformity  Heart:  Regular rate and rhythm, S1, S2 normal, no murmur, rub or gallop. Carotids 2+ without bruit.  Abdomen:   Soft, tender epigastrum and some RUQ as well. No masses, hernia. Nl BS  Genitalia:  Deferred  Rectal:  Deferred.  Extremities: Extremities normal, atraumatic, no cyanosis or edema  Pulses: 2+ and symmetric  Skin: Skin color, texture, turgor normal, no rashes or lesions  Neurologic: Normal affect, no gross deficits.     Labs: CBC  Basename 06/29/11 2156  WBC 9.8  HGB 12.3  HCT 35.8*  PLT 258   MET  Basename 06/29/11 2156  NA 139  K 3.5  CL 105  CO2 24  GLUCOSE 90  BUN 8  CREATININE 0.67  CALCIUM 9.1    Basename 06/29/11 2156  PROT 6.7  ALBUMIN 3.9  AST 153*  ALT 241*  ALKPHOS 112  BILITOT 2.5*  BILIDIR --  IBILI --  LIPASE 30    US Abdomen Complete  06/30/2011  *RADIOLOGY REPORT*  Clinical Data:  Abdominal pain; increased LFTs.  ABDOMINAL ULTRASOUND COMPLETE IMPRESSION:  1.  Dilatation of the intrahepatic biliary ducts and common hepatic  duct; the common hepatic duct measures 1.0 cm in diameter.  This raises concern for distal obstruction. 2.  Somewhat contracted gallbladder, with multiple small stones. Relatively diffuse gallbladder wall thickening may simply reflect a contracted gallbladder, though cholecystitis cannot be entirely excluded. 3.  Mild nodularity to the hepatic contour could reflect mild cirrhotic change; liver otherwise unremarkable in appearance.  Original Report Authenticated By: Tonia Ghent, M.D.   Assessment: Cholelthiasis, probable choledocholithiasis Hep C  Plan: GI to see pt for probable ERCP. Discussed  with pt that cholecystectomy indicated this admission. Will further discuss procedure. Marianna Fuss 06/30/2011, 9:54 AM

## 2011-06-30 NOTE — ED Notes (Signed)
Pt reports going to her PCP yesterday and being told that her abdominal ulcer was getting worse and they put her on nexium.  Pt took the nexium without relief.  Pt denies vomiting, reports nausea.  Denies diarrhea or blood in stool.  Pt tender on palpation.  Skin warm, dry and intact.  Neuro intact.

## 2011-06-30 NOTE — H&P (Addendum)
Cathy Santos is an 35 y.o. female.   Chief Complaint: Abdominal pain for 2days HPI:  This is 35 year old female with history of hepatitisC, chronic back pain , presented with sudden onset of right upper quadrant abdominal pain 10 radiate to he back,  stapping in nature associated with nausea but no vomiting , has regular BM,seen by her MD whereNexium prescribed but no relief , she presented to the ED,where patient found to have abnormal LFT, [place] of abdominal  Suggest gallstone, with dilated intrabililliary duct .  Past Medical History  Diagnosis Date  . Ulcer   . Hep C w/o coma, chronic Chronic back pain     Past Surgical History  Procedure Date  . Tubal ligation     Family History  Problem Relation Age of Onset  . Cancer Mother   . Diabetes Father   . Heart failure Father   . Hypertension Father   . Stroke Father    Social History:  She smoke cigarette for more than 15 years , denies any ETOH,,use marjuana last dose yesterday Allergies:  Allergies  Allergen Reactions  . Amoxicillin     Medications Prior to Admission  Medication Dose Route Frequency Provider Last Rate Last Dose  . 0.9 %  sodium chloride infusion   Intravenous Continuous Ward Givens, MD 125 mL/hr at 06/30/11 [phone]    . 0.9 %  sodium chloride infusion   Intravenous Continuous Ward Givens, MD      . diphenhydrAMINE (BENADRYL) injection 25 mg  25 mg Intravenous Once Ward Givens, MD   25 mg at 06/30/11 0229  . fentaNYL (SUBLIMAZE) injection 50 mcg  50 mcg Intravenous Once Ward Givens, MD   50 mcg at 06/30/11 0230  . fentaNYL (SUBLIMAZE) injection 50 mcg  50 mcg Intravenous Once Ward Givens, MD   50 mcg at 06/30/11 [phone]  . fentaNYL (SUBLIMAZE) injection 50 mcg  50 mcg Intravenous Q1H PRN Ward Givens, MD      . ondansetron (ZOFRAN) injection 4 mg  4 mg Intravenous Q1H PRN Ward Givens, MD   4 mg at 06/30/11 0231  . ondansetron (ZOFRAN) injection 4 mg  4 mg Intravenous Q1H PRN Ward Givens, MD      . pantoprazole  (PROTONIX) injection 40 mg  40 mg Intravenous Once Ward Givens, MD      . pantoprazole (PROTONIX) injection 40 mg  40 mg Intravenous Once Ward Givens, MD   40 mg at 06/30/11 0231  . sodium chloride 0.9 % bolus 1,000 mL  1,000 mL Intravenous Once Ward Givens, MD   1,000 mL at 06/30/11 0200   No current outpatient prescriptions on file as of 06/29/2011.    Results for orders placed during the hospital encounter of 06/29/11 (from the past 48 hour(s))  COMPREHENSIVE METABOLIC PANEL     Status: Abnormal   Collection Time   06/29/11  9:56 PM      Component Value Range Comment   Sodium 139  135 - 145 (mEq/L)    Potassium 3.5  3.5 - 5.1 (mEq/L)    Chloride 105  96 - 112 (mEq/L)    CO2 24  19 - 32 (mEq/L)    Glucose, Bld 90  70 - 99 (mg/dL)    BUN 8  6 - 23 (mg/dL)    Creatinine, Ser 5.40  0.50 - 1.10 (mg/dL)    Calcium 9.1  8.4 - 10.5 (mg/dL)  Total Protein 6.7  6.0 - 8.3 (g/dL)    Albumin 3.9  3.5 - 5.2 (g/dL)    AST 562 (*) 0 - 37 (U/L)    ALT 241 (*) 0 - 35 (U/L)    Alkaline Phosphatase 112  39 - 117 (U/L)    Total Bilirubin 2.5 (*) 0.3 - 1.2 (mg/dL)    GFR calc non Af Amer >90  >90 (mL/min)    GFR calc Af Amer >90  >90 (mL/min)   CBC     Status: Abnormal   Collection Time   06/29/11  9:56 PM      Component Value Range Comment   WBC 9.8  4.0 - 10.5 (K/uL)    RBC 3.89  3.87 - 5.11 (MIL/uL)    Hemoglobin 12.3  12.0 - 15.0 (g/dL)    HCT 13.0 (*) 86.5 - 46.0 (%)    MCV 92.0  78.0 - 100.0 (fL)    MCH 31.6  26.0 - 34.0 (pg)    MCHC 34.4  30.0 - 36.0 (g/dL)    RDW 78.4  69.6 - 29.5 (%)    Platelets 258  150 - 400 (K/uL)   DIFFERENTIAL     Status: Normal   Collection Time   06/29/11  9:56 PM      Component Value Range Comment   Neutrophils Relative 69  43 - 77 (%)    Neutro Abs 6.8  1.7 - 7.7 (K/uL)    Lymphocytes Relative 23  12 - 46 (%)    Lymphs Abs 2.2  0.7 - 4.0 (K/uL)    Monocytes Relative 7  3 - 12 (%)    Monocytes Absolute 0.7  0.1 - 1.0 (K/uL)    Eosinophils Relative  1  0 - 5 (%)    Eosinophils Absolute 0.1  0.0 - 0.7 (K/uL)    Basophils Relative 0  0 - 1 (%)    Basophils Absolute 0.0  0.0 - 0.1 (K/uL)   LIPASE, BLOOD     Status: Normal   Collection Time   06/29/11  9:56 PM      Component Value Range Comment   Lipase 30  11 - 59 (U/L)   URINE RAPID DRUG SCREEN (HOSP PERFORMED)     Status: Abnormal   Collection Time   06/30/11  2:32 AM      Component Value Range Comment   Opiates NONE DETECTED  NONE DETECTED     Cocaine NONE DETECTED  NONE DETECTED     Benzodiazepines NONE DETECTED  NONE DETECTED     Amphetamines NONE DETECTED  NONE DETECTED     Tetrahydrocannabinol POSITIVE (*) NONE DETECTED     Barbiturates NONE DETECTED  NONE DETECTED    URINALYSIS, ROUTINE W REFLEX MICROSCOPIC     Status: Abnormal   Collection Time   06/30/11  2:33 AM      Component Value Range Comment   Color, Urine YELLOW  YELLOW     Appearance HAZY (*) CLEAR     Specific Gravity, Urine 1.015  1.005 - 1.030     pH 7.5  5.0 - 8.0     Glucose, UA NEGATIVE  NEGATIVE (mg/dL)    Hgb urine dipstick MODERATE (*) NEGATIVE     Bilirubin Urine MODERATE (*) NEGATIVE     Ketones, ur >80  NEGATIVE (mg/dL) NEGATIVE   Protein, ur NEGATIVE  NEGATIVE (mg/dL)    Urobilinogen, UA 0.2  0.0 - 1.0 (mg/dL) 2.0   Nitrite NEGATIVE  NEGATIVE     Leukocytes, UA NEGATIVE  NEGATIVE    URINE MICROSCOPIC-ADD ON     Status: Normal   Collection Time   06/30/11  2:33 AM      Component Value Range Comment   Squamous Epithelial / LPF RARE  RARE     WBC, UA 0-2  <3 (WBC/hpf)    RBC / HPF 3-6  <3 (RBC/hpf)    Bacteria, UA RARE  RARE     Urine-Other MICROSCOPIC EXAM PERFORMED ON UNCONCENTRATED URINE      US Abdomen Complete  06/30/2011  *RADIOLOGY REPORT*  Clinical Data:  Abdominal pain; increased LFTs.  ABDOMINAL ULTRASOUND COMPLETE  Comparison:  Abdominal ultrasound performed 04/08/2010  Findings:  Gallbladder: The gallbladder is somewhat contracted, with multiple small stones.  There is  relatively diffuse wall thickening along the gallbladder, to 0.4 cm.  No ultrasonographic Murphy's sign is elicited, though the patient is partially sedated.  This could simply reflect a contracted gallbladder, though cholecystitis cannot be entirely excluded.  Common Bile Duct:  1.0 cm in diameter; dilated to the level of the pancreatic head, raising concern for distal obstruction.  Liver:  Grossly normal parenchymal echogenicity and echotexture; no focal lesions identified.  Mild nodularity to the hepatic contour could reflect mild cirrhotic change.  Limited Doppler evaluation demonstrates normal blood flow within the liver. Intrahepatic biliary ductal dilatation is noted; this may explain the patient's elevated LFTs.  IVC:  Unremarkable in appearance.  Pancreas:  Although the pancreas is difficult to visualize in its entirety due to overlying bowel gas, no focal pancreatic abnormality is identified.  Spleen:  10.1 cm in length; within normal limits in size and echotexture.  Right kidney:  12.2 cm in length; normal in size, configuration and parenchymal echogenicity.  No evidence of mass or hydronephrosis.  Left kidney:  12.8 cm in length; normal in size, configuration and parenchymal echogenicity.  No evidence of mass or hydronephrosis.  Abdominal Aorta:  Normal in caliber; no aneurysm identified.  IMPRESSION:  1.  Dilatation of the intrahepatic biliary ducts and common hepatic duct; the common hepatic duct measures 1.0 cm in diameter.  This raises concern for distal obstruction. 2.  Somewhat contracted gallbladder, with multiple small stones. Relatively diffuse gallbladder wall thickening may simply reflect a contracted gallbladder, though cholecystitis cannot be entirely excluded. 3.  Mild nodularity to the hepatic contour could reflect mild cirrhotic change; liver otherwise unremarkable in appearance.  Original Report Authenticated By: Tonia Ghent, M.D.    Review of Systems  Constitutional: Negative for  fever, chills, weight loss, malaise/fatigue and diaphoresis.  HENT: Negative.  Negative for ear pain and neck pain.   Eyes: Negative.   Respiratory: Negative.  Negative for cough, hemoptysis, sputum production, shortness of breath and wheezing.   Cardiovascular: Negative.  Negative for chest pain, palpitations, orthopnea, claudication, leg swelling and PND.  Gastrointestinal: Positive for nausea and abdominal pain. Negative for vomiting, diarrhea, constipation, blood in stool and melena.  Genitourinary: Negative.   Musculoskeletal: Positive for back pain. Negative for myalgias.  Skin: Negative for itching and rash.  Neurological: Negative.  Negative for weakness and headaches.  Endo/Heme/Allergies: Negative.     Blood pressure 90/54, pulse 59, temperature 97.7 F (36.5 C), temperature source Oral, resp. rate 16, last menstrual period 06/22/2011, SpO2 99.00%. Physical Exam  Constitutional: She is oriented to person, place, and time. She appears well-developed and well-nourished.  HENT:  Head: Normocephalic.  Right Ear: External ear normal.  Left Ear: External  ear normal.       abscent incisors teeth  Eyes: Conjunctivae are normal. Pupils are equal, round, and reactive to light. Right eye exhibits no discharge. Left eye exhibits no discharge.  Neck: No JVD present. No tracheal deviation present. No thyromegaly present.  Cardiovascular: Normal rate and regular rhythm.  Exam reveals friction rub. Exam reveals no gallop.   No murmur heard. Respiratory: She has no wheezes. She has no rales. She exhibits no tenderness.  GI: She exhibits no mass. There is tenderness. There is no rebound and no guarding.  Musculoskeletal: Normal range of motion. She exhibits no tenderness.  Lymphadenopathy:    She has no cervical adenopathy.  Neurological: She is alert and oriented to person, place, and time. She has normal reflexes. She displays normal reflexes. No cranial nerve deficit. She exhibits normal  muscle tone. Coordination normal.  Skin: Skin is warm. No rash noted. No erythema. No pallor.     Assessment/Plan  1- this 55 female admitted with right upper quadrant abdominal pain found to have abnormal LFT, and [place] suggest gallstone  patient will be admit to regular floor ,clear liquid diet , zosyn will be order, pain meidications and nausea medication will be order, GI consulted and Surgical team will be copnsulted for cholecystectomy and ERCP 2-mild hypotension : asymptomatic likely her baseline , IV FLUID 3- abnormal LFT: secondary to number 1, monitor, protonix will be order.  Natilie Krabbenhoft I. 06/30/2011, 8:33 AM

## 2011-07-01 ENCOUNTER — Encounter (HOSPITAL_COMMUNITY)
Admission: EM | Disposition: A | Discharge status: Home / Self Care | Payer: Self-pay | Source: Ambulatory Visit | Attending: Internal Medicine

## 2011-07-01 LAB — COMPREHENSIVE METABOLIC PANEL
Albumin: 3.2 g/dL — ABNORMAL LOW (ref 3.5–5.2)
Alkaline Phosphatase: 131 U/L — ABNORMAL HIGH (ref 39–117)
BUN: 5 mg/dL — ABNORMAL LOW (ref 6–23)
CO2: 22 mEq/L (ref 19–32)
Chloride: 109 mEq/L (ref 96–112)
Creatinine, Ser: 0.6 mg/dL (ref 0.50–1.10)
GFR calc Af Amer: >90 mL/min (ref >90)
GFR calc non Af Amer: >90 mL/min (ref >90)
Glucose, Bld: 84 mg/dL (ref 70–99)
Potassium: 3.1 mEq/L — ABNORMAL LOW (ref 3.5–5.1)
Total Bilirubin: 1.3 mg/dL — ABNORMAL HIGH (ref 0.3–1.2)

## 2011-07-01 LAB — CBC
HCT: 33.7 % — ABNORMAL LOW (ref 36.0–46.0)
Hemoglobin: 11.3 g/dL — ABNORMAL LOW (ref 12.0–15.0)
MCV: 92.6 fL (ref 78.0–100.0)
RDW: 14.2 % (ref 11.5–15.5)
WBC: 6.6 10*3/uL (ref 4.0–10.5)

## 2011-07-01 SURGERY — ERCP, WITH INTERVENTION IF INDICATED
Anesthesia: Monitor Anesthesia Care

## 2011-07-01 MED ORDER — INFLUENZA VIRUS VACC SPLIT PF IM SUSP
0.5000 mL | INTRAMUSCULAR | Status: DC
  Filled 2011-07-01: qty 0.5

## 2011-07-01 MED ORDER — POTASSIUM CHLORIDE 10 MEQ/100ML IV SOLN
10.0000 meq | INTRAVENOUS | Status: AC
  Administered 2011-07-01 – 2011-07-02 (×4): 10 meq via INTRAVENOUS
  Filled 2011-07-01 (×4): qty 100

## 2011-07-01 MED ORDER — PNEUMOCOCCAL VAC POLYVALENT 25 MCG/0.5ML IJ INJ
0.5000 mL | INJECTION | INTRAMUSCULAR | Status: DC
  Filled 2011-07-01: qty 0.5

## 2011-07-01 NOTE — Progress Notes (Signed)
Subjective: Still with abdomen pain, and some nausea, she is scheduled for ER CP today bjective: Vital signs in last 24 hours: Filed Vitals:   06/30/11 1334 06/30/11 1443 06/30/11 2216 07/01/11 0700  BP: 97/55 111/70 110/60 101/65  Pulse: 55 62 64 59  Temp:  98.1 F (36.7 C) 98.4 F (36.9 C) 97.8 F (36.6 C)  TempSrc:  Oral Oral Oral  Resp: 24 18 20 18   Height:  5\' 4"  (1.626 m)    Weight:  61.19 kg (134 lb 14.4 oz)    SpO2: 100% 100% 100% 100%   Weight change:   Intake/Output Summary (Last 24 hours) at 07/01/11 1222 Last data filed at 07/01/11 0700  Gross per 24 hour  Intake    200 ml  Output      2 ml  Net    198 ml   Constitutional: She is oriented to person, place, and time. She appears well-developed and well-nourished.  HENT:  Head: Normocephalic.  Right Ear: External ear normal.  Left Ear: External ear normal.  abscent incisors teeth  Eyes: Conjunctivae are normal. Pupils are equal, round, and reactive to light. Right eye exhibits no discharge. Left eye exhibits no discharge.  Neck: No JVD present. No tracheal deviation present. No thyromegaly present.  Cardiovascular: Normal rate and regular rhythm.. Exam reveals no gallop.  No murmur heard.  Respiratory: She has no wheezes. She has no rales. She exhibits no tenderness.  GI: She exhibits no mass. There is tenderness. There is no rebound and no guarding.  Musculoskeletal: Normal range of motion. She exhibits no tenderness.  Lymphadenopathy:  She has no cervical adenopathy.  Neurological: She is alert and oriented to person, place, and time. She has normal reflexes. She displays normal reflexes. No cranial nerve deficit. She exhibits normal muscle tone. Coordination normal.  Skin: Skin is warm. No rash noted. No erythema. No pallor.    Lab Results:  [facility] 07/01/11 0651 06/29/11 2156  NA 142 139  K 3.1* 3.5  CL 109 105  CO2 22 24  GLUCOSE 84 90  BUN 5* 8  CREATININE 0.60 0.67  CALCIUM 8.7 9.1  MG -- --    PHOS -- --    [facility] 07/01/11 0651 06/29/11 2156  AST 75* 153*  ALT 183* 241*  ALKPHOS 131* 112  BILITOT 1.3* 2.5*  PROT 5.9* 6.7  ALBUMIN 3.2* 3.9    Basename 06/29/11 2156  LIPASE 30  AMYLASE --    Basename 07/01/11 0651 06/29/11 2156  WBC 6.6 9.8  NEUTROABS -- 6.8  HGB 11.3* 12.3  HCT 33.7* 35.8*  MCV 92.6 92.0  PLT 226 258    Studies/Results: US Abdomen Complete  06/30/2011  *RADIOLOGY REPORT*  Clinical Data:  Abdominal pain; increased LFTs.  ABDOMINAL ULTRASOUND COMPLETE  Comparison:  Abdominal ultrasound performed 04/08/2010  Findings:  Gallbladder: The gallbladder is somewhat contracted, with multiple small stones.  There is relatively diffuse wall thickening along the gallbladder, to 0.4 cm.  No ultrasonographic Murphy's sign is elicited, though the patient is partially sedated.  This could simply reflect a contracted gallbladder, though cholecystitis cannot be entirely excluded.  Common Bile Duct:  1.0 cm in diameter; dilated to the level of the pancreatic head, raising concern for distal obstruction.  Liver:  Grossly normal parenchymal echogenicity and echotexture; no focal lesions identified.  Mild nodularity to the hepatic contour could reflect mild cirrhotic change.  Limited Doppler evaluation demonstrates normal blood flow within the liver. Intrahepatic biliary ductal dilatation  is noted; this may explain the patient's elevated LFTs.  IVC:  Unremarkable in appearance.  Pancreas:  Although the pancreas is difficult to visualize in its entirety due to overlying bowel gas, no focal pancreatic abnormality is identified.  Spleen:  10.1 cm in length; within normal limits in size and echotexture.  Right kidney:  12.2 cm in length; normal in size, configuration and parenchymal echogenicity.  No evidence of mass or hydronephrosis.  Left kidney:  12.8 cm in length; normal in size, configuration and parenchymal echogenicity.  No evidence of mass or hydronephrosis.  Abdominal  Aorta:  Normal in caliber; no aneurysm identified.  IMPRESSION:  1.  Dilatation of the intrahepatic biliary ducts and common hepatic duct; the common hepatic duct measures 1.0 cm in diameter.  This raises concern for distal obstruction. 2.  Somewhat contracted gallbladder, with multiple small stones. Relatively diffuse gallbladder wall thickening may simply reflect a contracted gallbladder, though cholecystitis cannot be entirely excluded. 3.  Mild nodularity to the hepatic contour could reflect mild cirrhotic change; liver otherwise unremarkable in appearance.  Original Report Authenticated By: Tonia Ghent, M.D.   Ct Abdomen Pelvis W Contrast  06/30/2011  *RADIOLOGY REPORT*  Clinical Data: Pain, history hepatitis C  CT ABDOMEN AND PELVIS WITH CONTRAST  Technique:  Multidetector CT imaging of the abdomen and pelvis was performed following the standard protocol during bolus administration of intravenous contrast.  Contrast: 80mL OMNIPAQUE IOHEXOL 300 MG/ML IV SOLN  Comparison: Ultrasound abdomen of 06/30/2011  Findings: The lung bases are clear.  The liver is inhomogeneous as noted on recent ultrasound, possibly indicating mild changes of cirrhosis.  There is no significant nodularity to the periphery of the liver noted.  There is slight prominence of the central intrahepatic ducts and the common bile duct which measures up to 10 mm in diameter.  The gallbladder is somewhat thick-walled but partially contracted and there is some higher attenuation debris within the gallbladder which may represent sludge and/or noncalcified gallstones.  On the coronal images the common bile duct is prominent, tapering distally.  This could indicate either distal common bile duct non visualized calculus, stricture, or mass.  No definite soft tissue mass is seen.  ERCP may be helpful to assess further.  The pancreatic parenchyma is unremarkable with no evidence of mass and no pancreatic ductal dilatation is seen. The adrenal  glands and spleen are unremarkable.  The stomach is not well distended.  The kidneys enhance with no calculus or mass and no hydronephrosis is seen.  The abdominal aorta is normal in caliber.  No adenopathy is seen, with only small retroperitoneal nodes present.  The uterus is normal in size.  There are right ovarian cysts present.  No significant free fluid is seen within the pelvis. Only tiny left ovarian cysts are noted.  The urinary bladder is unremarkable.  The appendix and terminal ileum are unremarkable. No abnormality of the colon is seen.  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  Normal alignment is maintained.  IMPRESSION:  1.  Prominent central intrahepatic ducts and common bile duct to the distal common bile duct where there is tapering.  Consider distal common bile duct nonvisualized calculus, stricture, or less likely mass.  ERCP may be helpful. 2.  The gallbladder wall is somewhat thickened and there is some debris within the gallbladder consistent with sludge and/or noncalcified gallstones. 3.  Somewhat inhomogeneous liver may represent changes of cirrhosis as questioned by recent ultrasound. 4.  Several right ovarian cyst. 5.  Bilateral pars defects at L5.  Original Report Authenticated By: Juline Patch, M.D.    Medications: I have reviewed the patient's current medications. Scheduled Meds:   . ciprofloxacin  400 mg Intravenous Q12H  . heparin  5,000 Units Subcutaneous Q8H  . heparin subcutaneous  5,000 Units Subcutaneous Q8H  . influenza  inactive virus vaccine  0.5 mL Intramuscular Tomorrow-1000  . nicotine  21 mg Transdermal Daily  . pantoprazole (PROTONIX) IV  40 mg Intravenous Q supper  . pneumococcal 23 valent vaccine  0.5 mL Intramuscular Tomorrow-1000  . DISCONTD: influenza  inactive virus vaccine  0.5 mL Intramuscular Tomorrow-1000  . DISCONTD: pneumococcal 23 valent vaccine  0.5 mL Intramuscular Tomorrow-1000   Continuous Infusions:   . sodium chloride 100 mL/hr at  07/01/11 0024  . DISCONTD: sodium chloride 125 mL/hr at 06/30/11 1029   PRN Meds:.clonazePAM, iohexol, morphine, ondansetron, oxyCODONE-acetaminophen, DISCONTD: ondansetron, DISCONTD: ondansetron, DISCONTD: oxycodone-acetaminophen  Assessment/Plan: 1-common bile duct stone for ERCP today unlikely extraction of the stone would continue with Cipro 2-Gall stone for cholecystectomy in am 3-hypokalemia would replace 4-history of hep C followed up as outpatient 5-ongoing tobacco abuse nicotine patch.   LOS: 2 days  Cathy Santos I. 07/01/2011, 12:22 PM

## 2011-07-01 NOTE — Progress Notes (Signed)
I have seen and examined the patient and agree with the assessment and plans  

## 2011-07-01 NOTE — Progress Notes (Signed)
Utilization review complete 

## 2011-07-01 NOTE — Progress Notes (Signed)
[facility] Progress Note  Subjective: Feeling a bit better with less pain.  Objective: Vital signs in last 24 hours: Temp:  [97.8 F (36.6 C)-98.4 F (36.9 C)] 98.3 F (36.8 C) (11/15 1431) Pulse Rate:  [59-66] 66  (11/15 1431) Resp:  [18-20] 18  (11/15 1431) BP: (101-121)/(60-71) 121/71 mmHg (11/15 1431) SpO2:  [99 %-100 %] 99 % (11/15 1431) Last BM Date: 06/29/11   Intake/Output from previous day: 11/14 0701 - 11/15 0700 In: 200 [IV Piggyback:200] Out: 2 [Urine:2] Intake/Output this shift: Total I/O In: 800 [I.V.:800] Out: -   Lab Results:  Basename 07/01/11 0651 06/29/11 2156  WBC 6.6 9.8  HGB 11.3* 12.3  HCT 33.7* 35.8*  PLT 226 258   BMET  Basename 07/01/11 0651 06/29/11 2156  NA 142 139  K 3.1* 3.5  CL 109 105  CO2 22 24  GLUCOSE 84 90  BUN 5* 8  CREATININE 0.60 0.67  CALCIUM 8.7 9.1   LFT  Basename 07/01/11 0651  PROT 5.9*  ALBUMIN 3.2*  AST 75*  ALT 183*  ALKPHOS 131*  BILITOT 1.3*  BILIDIR --  IBILI --   PT/INR  Basename 06/30/11 1240  LABPROT 14.3  INR 1.09       Assessment / Plan: Dilated biliary tree suspicious for choledocholithiasis  Have moved ERCP to 0730- tomorrow due to backlog in OR.    Active Problems:  Abdominal  pain, other specified site  Gallbladder & bile duct stone with obstruction  Hepatitis C  Back pain     LOS: 2 days   Cathy Santos  07/01/2011, 4:33 PM    

## 2011-07-01 NOTE — Progress Notes (Signed)
  Subjective: Pt ok, pain less. No N/V.  Objective: Vital signs in last 24 hours: Temp:  [97.8 F (36.6 C)-98.4 F (36.9 C)] 97.8 F (36.6 C) (11/15 0700) Pulse Rate:  [55-83] 59  (11/15 0700) Resp:  [16-24] 18  (11/15 0700) BP: (97-112)/(55-70) 101/65 mmHg (11/15 0700) SpO2:  [99 %-100 %] 100 % (11/15 0700) Weight:  [134 lb 14.4 oz (61.19 kg)] 134 lb 14.4 oz (61.19 kg) (11/14 1443) Last BM Date: 06/29/11  Intake/Output this shift:    Physical Exam: Abdomen: soft, ND Still tender in epigastrum  Labs: CBC  Basename 07/01/11 0651 06/29/11 2156  WBC 6.6 9.8  HGB 11.3* 12.3  HCT 33.7* 35.8*  PLT 226 258   BMET  Basename 07/01/11 0651 06/29/11 2156  NA 142 139  K 3.1* 3.5  CL 109 105  CO2 22 24  GLUCOSE 84 90  BUN 5* 8  CREATININE 0.60 0.67  CALCIUM 8.7 9.1   LFT  Basename 07/01/11 0651 06/29/11 2156  PROT 5.9* --  ALBUMIN 3.2* --  AST 75* --  ALT 183* --  ALKPHOS 131* --  BILITOT 1.3* --  BILIDIR -- --  IBILI -- --  LIPASE -- 30   PT/INR  Basename 06/30/11 1240  LABPROT 14.3  INR 1.09   ABG No results found for this basename: PHART:2,PCO2:2,PO2:2,HCO3:2 in the last 72 hours  Studies/Results: ERCP: pending  Assessment: Active Problems:  Abdominal  pain, other specified site  Gallbladder & bile duct stone with obstruction  Hepatitis C  Back pain  Plan: ENDOSCOPIC RETROGRADE CHOLANGIOPANCREATOGRAPHY (ERCP) Planning Lap chole for tomorrow.   LOS: 2 days    Cathy Santos 07/01/2011

## 2011-07-02 ENCOUNTER — Inpatient Hospital Stay (HOSPITAL_COMMUNITY): Payer: Medicaid Other | Admitting: Anesthesiology

## 2011-07-02 ENCOUNTER — Inpatient Hospital Stay (HOSPITAL_COMMUNITY): Payer: Medicaid Other

## 2011-07-02 ENCOUNTER — Encounter (HOSPITAL_COMMUNITY): Payer: Self-pay | Admitting: *Deleted

## 2011-07-02 ENCOUNTER — Encounter (HOSPITAL_COMMUNITY): Payer: Self-pay | Admitting: Anesthesiology

## 2011-07-02 ENCOUNTER — Encounter (HOSPITAL_COMMUNITY)
Admission: EM | Disposition: A | Discharge status: Home / Self Care | Payer: Self-pay | Source: Ambulatory Visit | Attending: Internal Medicine

## 2011-07-02 ENCOUNTER — Encounter (HOSPITAL_COMMUNITY): Payer: Self-pay | Admitting: Internal Medicine

## 2011-07-02 DIAGNOSIS — K8020 Calculus of gallbladder without cholecystitis without obstruction: Secondary | ICD-10-CM

## 2011-07-02 DIAGNOSIS — R932 Abnormal findings on diagnostic imaging of liver and biliary tract: Secondary | ICD-10-CM

## 2011-07-02 HISTORY — PX: ERCP: SHX5425

## 2011-07-02 LAB — CBC
Platelets: 242 10*3/uL (ref 150–400)
RBC: 3.75 MIL/uL — ABNORMAL LOW (ref 3.87–5.11)
RDW: 14.5 % (ref 11.5–15.5)
WBC: 7.3 10*3/uL (ref 4.0–10.5)

## 2011-07-02 SURGERY — ERCP, WITH INTERVENTION IF INDICATED
Anesthesia: Moderate Sedation

## 2011-07-02 MED ORDER — INFLUENZA VIRUS VACC SPLIT PF IM SUSP
0.5000 mL | INTRAMUSCULAR | Status: DC
  Filled 2011-07-02: qty 0.5

## 2011-07-02 MED ORDER — MIDAZOLAM HCL 5 MG/5ML IJ SOLN
INTRAMUSCULAR | Status: DC | PRN
  Administered 2011-07-02: 2 mg via INTRAVENOUS

## 2011-07-02 MED ORDER — HYDROMORPHONE HCL PF 1 MG/ML IJ SOLN
0.2500 mg | INTRAMUSCULAR | Status: DC | PRN
Start: 2011-07-02 — End: 2011-07-02

## 2011-07-02 MED ORDER — GLYCOPYRROLATE 0.2 MG/ML IJ SOLN
INTRAMUSCULAR | Status: DC | PRN
  Administered 2011-07-02: .5 mg via INTRAVENOUS

## 2011-07-02 MED ORDER — LACTATED RINGERS IV SOLN
INTRAVENOUS | Status: DC | PRN
  Administered 2011-07-02: 07:00:00 via INTRAVENOUS

## 2011-07-02 MED ORDER — PROPOFOL 10 MG/ML IV EMUL
INTRAVENOUS | Status: DC | PRN
  Administered 2011-07-02: 180 mg via INTRAVENOUS

## 2011-07-02 MED ORDER — PROMETHAZINE HCL 25 MG/ML IJ SOLN: 6.2500 mg | INTRAMUSCULAR | Status: DC | PRN

## 2011-07-02 MED ORDER — MEPERIDINE HCL 25 MG/ML IJ SOLN: 6.2500 mg | INTRAMUSCULAR | Status: DC | PRN

## 2011-07-02 MED ORDER — PNEUMOCOCCAL VAC POLYVALENT 25 MCG/0.5ML IJ INJ
0.5000 mL | INJECTION | INTRAMUSCULAR | Status: DC
  Filled 2011-07-02: qty 0.5

## 2011-07-02 MED ORDER — OXYCODONE-ACETAMINOPHEN 5-325 MG PO TABS
1.0000 | ORAL_TABLET | ORAL | Status: DC | PRN
  Administered 2011-07-04: 2 via ORAL
  Filled 2011-07-02 (×2): qty 2

## 2011-07-02 MED ORDER — MORPHINE SULFATE 2 MG/ML IJ SOLN
1.0000 mg | INTRAMUSCULAR | Status: DC | PRN
  Administered 2011-07-02 – 2011-07-03 (×4): 2 mg via INTRAVENOUS
  Administered 2011-07-03: 4 mg via INTRAVENOUS
  Administered 2011-07-03 (×3): 2 mg via INTRAVENOUS
  Administered 2011-07-04 (×3): 4 mg via INTRAVENOUS
  Filled 2011-07-02: qty 1
  Filled 2011-07-02 (×2): qty 2
  Filled 2011-07-02 (×5): qty 1
  Filled 2011-07-02 (×2): qty 2
  Filled 2011-07-02: qty 1
  Filled 2011-07-02: qty 2

## 2011-07-02 MED ORDER — ROCURONIUM BROMIDE 100 MG/10ML IV SOLN
INTRAVENOUS | Status: DC | PRN
  Administered 2011-07-02: 40 mg via INTRAVENOUS

## 2011-07-02 MED ORDER — IOHEXOL 300 MG/ML  SOLN
INTRAMUSCULAR | Status: DC | PRN
  Administered 2011-07-02: 09:00:00 via INTRAMUSCULAR

## 2011-07-02 MED ORDER — NEOSTIGMINE METHYLSULFATE 1 MG/ML IJ SOLN
INTRAMUSCULAR | Status: DC | PRN
  Administered 2011-07-02: 3 mg via INTRAVENOUS

## 2011-07-02 MED ORDER — KETOROLAC TROMETHAMINE 30 MG/ML IJ SOLN: 15.0000 mg | Freq: Once | INTRAMUSCULAR | Status: DC | PRN

## 2011-07-02 MED ORDER — ONDANSETRON HCL 4 MG/2ML IJ SOLN
INTRAMUSCULAR | Status: DC | PRN
  Administered 2011-07-02: 4 mg via INTRAVENOUS

## 2011-07-02 MED ORDER — FENTANYL CITRATE 0.05 MG/ML IJ SOLN
INTRAMUSCULAR | Status: DC | PRN
  Administered 2011-07-02: 100 ug via INTRAVENOUS

## 2011-07-02 MED ORDER — CEFAZOLIN SODIUM 1-5 GM-% IV SOLN
1.0000 g | INTRAVENOUS | Status: AC
  Administered 2011-07-03: 1 g via INTRAVENOUS
  Filled 2011-07-02: qty 50

## 2011-07-02 NOTE — Anesthesia Postprocedure Evaluation (Signed)
  Anesthesia Post-op Note  Patient: Cathy Santos  Procedure(s) Performed:  ENDOSCOPIC RETROGRADE CHOLANGIOPANCREATOGRAPHY (ERCP)  Patient Location: PACU  Anesthesia Type: General  Level of Consciousness: awake  Airway and Oxygen Therapy: Patient Spontanous Breathing  Post-op Pain: none  Post-op Assessment: Post-op Vital signs reviewed  Post-op Vital Signs: stable  Complications: No apparent anesthesia complications

## 2011-07-02 NOTE — Progress Notes (Signed)
ERCP showed dilated biliary tree but no stones. Biliary sphincterotomy performed.  Please see hyperlink under procedures to see full report.

## 2011-07-02 NOTE — Progress Notes (Signed)
I have seen and examined the patient and agree with the assessment and plans  

## 2011-07-02 NOTE — Progress Notes (Signed)
Subjective: She's still complaining of right hypochondrium pain and epigastric pain and she actually wants to eat, ERCP report without common bile  duct stone, status post sphinerectomy Objective: Vital signs in last 24 hours: Filed Vitals:   07/02/11 0955 07/02/11 0957 07/02/11 1003 07/02/11 1437  BP:   135/85 100/64  Pulse:  58  57  Temp:    97.7 F (36.5 C)  TempSrc:    Oral  Resp:  30 25 62  Height:      Weight:      SpO2: 100%   100%   Weight change:   Intake/Output Summary (Last 24 hours) at 07/02/11 1442 Last data filed at 07/02/11 0909  Gross per 24 hour  Intake 2741.67 ml  Output      0 ml  Net 2741.67 ml    BP 100/64  Pulse 57  Temp(Src) 97.7 F (36.5 C) (Oral)  Resp 62  Ht 5\' 4"  (1.626 m)  Wt 61.19 kg (134 lb 14.4 oz)  BMI 23.16 kg/m2  SpO2 100%  LMP 06/22/2011  General Appearance:    Alert, cooperative, no distress, appears stated age  Head:    Normocephalic, without obvious abnormality, atraumatic  Eyes:    PERRL, conjunctiva/corneas clear, EOM's intact, fundi    benign, both eyes  Ears:    Normal TM's and external ear canals, both ears  Nose:   Nares normal, septum midline, mucosa normal, no drainage    or sinus tenderness  Throat:   Lips, mucosa, and tongue normal; teeth and gums normal  Neck:   Supple, symmetrical, trachea midline, no adenopathy;    thyroid:  no enlargement/tenderness/nodules; no carotid   bruit or JVD  Back:     Symmetric, no curvature, ROM normal, no CVA tenderness  Lungs:     Clear to auscultation bilaterally, respirations unlabored  Chest Wall:    No tenderness or deformity   Heart:    Regular rate and rhythm, S1 and S2 normal, no murmur, rub   or gallop  Breast Exam:    No tenderness, masses, or nipple abnormality  Abdomen:     Soft, mild-tender, bowel sounds active all four quadrants,    no masses, no organomegaly, murphy sign negative  Genitalia:    Normal female without lesion, discharge or tenderness  Rectal:    Normal  tone, normal prostate, no masses or tenderness;   guaiac negative stool  Extremities:   Extremities normal, atraumatic, no cyanosis or edema  Pulses:   2+ and symmetric all extremities  Skin:   Skin color, texture, turgor normal, no rashes or lesions  Lymph nodes:   Cervical, supraclavicular, and axillary nodes normal  Neurologic:   CNII-XII intact, normal strength, sensation and reflexes    throughout    Lab Results:  [facility] 07/01/11 0651 06/29/11 2156  NA 142 139  K 3.1* 3.5  CL 109 105  CO2 22 24  GLUCOSE 84 90  BUN 5* 8  CREATININE 0.60 0.67  CALCIUM 8.7 9.1  MG -- --  PHOS -- --    [facility] 07/01/11 0651 06/29/11 2156  AST 75* 153*  ALT 183* 241*  ALKPHOS 131* 112  BILITOT 1.3* 2.5*  PROT 5.9* 6.7  ALBUMIN 3.2* 3.9    Basename 06/29/11 2156  LIPASE 30  AMYLASE --    Basename 07/02/11 0519 07/01/11 0651 06/29/11 2156  WBC 7.3 6.6 --  NEUTROABS -- -- 6.8  HGB 11.5* 11.3* --  HCT 34.7* 33.7* --  MCV  92.5 92.6 --  PLT 242 226 --     Micro Results: No results found for this or any previous visit (from the past 240 hour(s)).  Studies/Results: US Abdomen Complete  06/30/2011  *RADIOLOGY REPORT*  Clinical Data:  Abdominal pain; increased LFTs.  ABDOMINAL ULTRASOUND COMPLETE  Comparison:  Abdominal ultrasound performed 04/08/2010  Findings:  Gallbladder: The gallbladder is somewhat contracted, with multiple small stones.  There is relatively diffuse wall thickening along the gallbladder, to 0.4 cm.  No ultrasonographic Murphy's sign is elicited, though the patient is partially sedated.  This could simply reflect a contracted gallbladder, though cholecystitis cannot be entirely excluded.  Common Bile Duct:  1.0 cm in diameter; dilated to the level of the pancreatic head, raising concern for distal obstruction.  Liver:  Grossly normal parenchymal echogenicity and echotexture; no focal lesions identified.  Mild nodularity to the hepatic contour could reflect mild  cirrhotic change.  Limited Doppler evaluation demonstrates normal blood flow within the liver. Intrahepatic biliary ductal dilatation is noted; this may explain the patient's elevated LFTs.  IVC:  Unremarkable in appearance.  Pancreas:  Although the pancreas is difficult to visualize in its entirety due to overlying bowel gas, no focal pancreatic abnormality is identified.  Spleen:  10.1 cm in length; within normal limits in size and echotexture.  Right kidney:  12.2 cm in length; normal in size, configuration and parenchymal echogenicity.  No evidence of mass or hydronephrosis.  Left kidney:  12.8 cm in length; normal in size, configuration and parenchymal echogenicity.  No evidence of mass or hydronephrosis.  Abdominal Aorta:  Normal in caliber; no aneurysm identified.  IMPRESSION:  1.  Dilatation of the intrahepatic biliary ducts and common hepatic duct; the common hepatic duct measures 1.0 cm in diameter.  This raises concern for distal obstruction. 2.  Somewhat contracted gallbladder, with multiple small stones. Relatively diffuse gallbladder wall thickening may simply reflect a contracted gallbladder, though cholecystitis cannot be entirely excluded. 3.  Mild nodularity to the hepatic contour could reflect mild cirrhotic change; liver otherwise unremarkable in appearance.  Original Report Authenticated By: Tonia Ghent, M.D.   Ct Abdomen Pelvis W Contrast  06/30/2011  *RADIOLOGY REPORT*  Clinical Data: Pain, history hepatitis C  CT ABDOMEN AND PELVIS WITH CONTRAST  Technique:  Multidetector CT imaging of the abdomen and pelvis was performed following the standard protocol during bolus administration of intravenous contrast.  Contrast: 80mL OMNIPAQUE IOHEXOL 300 MG/ML IV SOLN  Comparison: Ultrasound abdomen of 06/30/2011  Findings: The lung bases are clear.  The liver is inhomogeneous as noted on recent ultrasound, possibly indicating mild changes of cirrhosis.  There is no significant nodularity to the  periphery of the liver noted.  There is slight prominence of the central intrahepatic ducts and the common bile duct which measures up to 10 mm in diameter.  The gallbladder is somewhat thick-walled but partially contracted and there is some higher attenuation debris within the gallbladder which may represent sludge and/or noncalcified gallstones.  On the coronal images the common bile duct is prominent, tapering distally.  This could indicate either distal common bile duct non visualized calculus, stricture, or mass.  No definite soft tissue mass is seen.  ERCP may be helpful to assess further.  The pancreatic parenchyma is unremarkable with no evidence of mass and no pancreatic ductal dilatation is seen. The adrenal glands and spleen are unremarkable.  The stomach is not well distended.  The kidneys enhance with no calculus or mass and no  hydronephrosis is seen.  The abdominal aorta is normal in caliber.  No adenopathy is seen, with only small retroperitoneal nodes present.  The uterus is normal in size.  There are right ovarian cysts present.  No significant free fluid is seen within the pelvis. Only tiny left ovarian cysts are noted.  The urinary bladder is unremarkable.  The appendix and terminal ileum are unremarkable. No abnormality of the colon is seen.  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  Normal alignment is maintained.  IMPRESSION:  1.  Prominent central intrahepatic ducts and common bile duct to the distal common bile duct where there is tapering.  Consider distal common bile duct nonvisualized calculus, stricture, or less likely mass.  ERCP may be helpful. 2.  The gallbladder wall is somewhat thickened and there is some debris within the gallbladder consistent with sludge and/or noncalcified gallstones. 3.  Somewhat inhomogeneous liver may represent changes of cirrhosis as questioned by recent ultrasound. 4.  Several right ovarian cyst. 5.  Bilateral pars defects at L5.  Original Report  Authenticated By: Juline Patch, M.D.    Medications: I have reviewed the patient's current medications. Scheduled Meds:   . ciprofloxacin  400 mg Intravenous Q12H  . heparin subcutaneous  5,000 Units Subcutaneous Q8H  . influenza  inactive virus vaccine  0.5 mL Intramuscular Tomorrow-1000  . nicotine  21 mg Transdermal Daily  . pantoprazole (PROTONIX) IV  40 mg Intravenous Q supper  . pneumococcal 23 valent vaccine  0.5 mL Intramuscular Tomorrow-1000  . potassium chloride  10 mEq Intravenous Q1 Hr x 4  . DISCONTD: influenza  inactive virus vaccine  0.5 mL Intramuscular Tomorrow-1000  . DISCONTD: pneumococcal 23 valent vaccine  0.5 mL Intramuscular Tomorrow-1000   Continuous Infusions:   . sodium chloride 100 mL/hr at 07/02/11 1047   PRN Meds:.clonazePAM, morphine, ondansetron, oxyCODONE-acetaminophen, DISCONTD: 10 mL Omnipaque 300 mg/mL mixed with 0.9% normal saline (10 mL) injection, DISCONTD: HYDROmorphone, DISCONTD: ketorolac, DISCONTD: meperidine, DISCONTD: meperidine, DISCONTD: morphine, DISCONTD: oxyCODONE-acetaminophen, DISCONTD: promethazine  Assessment/Plan: 1-right upper hypochondrium pain likely secondary to, to gallbladder stones, status post ERCP which was negative for any, and a bile duct stone,, to proceed with laparoscopic cholecystectomy per surgical team' 2-hypokalemia would repeat the  electrolyte 3-abnormal LFTs repeat CMAC   LOS: 3 days  Kiya Eno I. 07/02/2011, 2:42 PM

## 2011-07-02 NOTE — Interval H&P Note (Signed)
History and Physical Interval Note:   07/02/2011   7:33 AM   Cathy Santos  has presented today for surgery, with the diagnosis of CHOLEDOHOLITHIASIS  The various methods of treatment have been discussed with the patient and family. After consideration of risks, benefits and other options for treatment, the patient has consented to  Procedure(s): ENDOSCOPIC RETROGRADE CHOLANGIOPANCREATOGRAPHY (ERCP) as a surgical intervention .  The patients' history has been reviewed, patient examined, no change in status, stable for surgery.  I have reviewed the patients' chart and labs.  Questions were answered to the patient's satisfaction.     Stan Head  MD

## 2011-07-02 NOTE — Transfer of Care (Signed)
Immediate Anesthesia Transfer of Care Note  Patient: Cathy Santos  Procedure(s) Performed:  ENDOSCOPIC RETROGRADE CHOLANGIOPANCREATOGRAPHY (ERCP)  Patient Location: PACU  Anesthesia Type: General  Level of Consciousness: awake, alert  and oriented  Airway & Oxygen Therapy: Patient Spontanous Breathing and Patient connected to nasal cannula oxygen  Post-op Assessment: Report given to PACU RN, Post -op Vital signs reviewed and stable and Patient moving all extremities X 4  Post vital signs: Reviewed and stable  Complications: No apparent anesthesia complications

## 2011-07-02 NOTE — Preoperative (Signed)
Beta Blockers   Reason not to administer Beta Blockers:Not Applicable 

## 2011-07-02 NOTE — OR Nursing (Signed)
Ercp with sphincterotomy. 25cc omnipaque 1:1 used. 57 sec of fluro time. Pt monitored and sedated by anesthesia. PACU to recover pt. S Reia Viernes RN

## 2011-07-02 NOTE — Progress Notes (Signed)
Day of Surgery  Subjective: Pt seen after ERCP this am. Still a little groggy and still c/o mild epigastric pain. No stones seen on ERCP, but sphincterotomy done.  Objective: Vital signs in last 24 hours: Temp:  [98.1 F (36.7 C)-98.9 F (37.2 C)] 98.9 F (37.2 C) (11/16 0905) Pulse Rate:  [55-67] 58  (11/16 0957) Resp:  [16-30] 25  (11/16 1003) BP: (111-135)/(56-89) 135/85 mmHg (11/16 1003) SpO2:  [98 %-100 %] 100 % (11/16 0955) Last BM Date: 06/29/11  Intake/Output this shift: Total I/O In: 900 [I.V.:900] Out: -   Physical Exam: Abdomen: soft, ND. Mild tender epigastric  Labs: CBC  Basename 07/02/11 0519 07/01/11 0651  WBC 7.3 6.6  HGB 11.5* 11.3*  HCT 34.7* 33.7*  PLT 242 226   BMET  Basename 07/01/11 0651 06/29/11 2156  NA 142 139  K 3.1* 3.5  CL 109 105  CO2 22 24  GLUCOSE 84 90  BUN 5* 8  CREATININE 0.60 0.67  CALCIUM 8.7 9.1   LFT  Basename 07/01/11 0651 06/29/11 2156  PROT 5.9* --  ALBUMIN 3.2* --  AST 75* --  ALT 183* --  ALKPHOS 131* --  BILITOT 1.3* --  BILIDIR -- --  IBILI -- --  LIPASE -- 30   PT/INR  Basename 06/30/11 1240  LABPROT 14.3  INR 1.09   ABG No results found for this basename: PHART:2,PCO2:2,PO2:2,HCO3:2 in the last 72 hours   Assessment: Active Problems:  Abdominal  pain, other specified site  Gallbladder & bile duct stone with obstruction  Hepatitis C  Back pain  s/p Procedure(s): ENDOSCOPIC RETROGRADE CHOLANGIOPANCREATOGRAPHY (ERCP) Plan: We would generally not do lap chole same day as ERCP and schedule is very busy, but pt is posted for later today. Dr. Magnus Ivan may proceed if she looks well this afternoon and schedule permits. Otherwise, would plan for surgery tomorrow. If decision is for tomorrow, will allow full liquid diet.  LOS: 3 days    Marianna Fuss 07/02/2011

## 2011-07-02 NOTE — H&P (View-Only) (Signed)
[facility] Progress Note  Subjective: Feeling a bit better with less pain.  Objective: Vital signs in last 24 hours: Temp:  [97.8 F (36.6 C)-98.4 F (36.9 C)] 98.3 F (36.8 C) (11/15 1431) Pulse Rate:  [59-66] 66  (11/15 1431) Resp:  [18-20] 18  (11/15 1431) BP: (101-121)/(60-71) 121/71 mmHg (11/15 1431) SpO2:  [99 %-100 %] 99 % (11/15 1431) Last BM Date: 06/29/11   Intake/Output from previous day: 11/14 0701 - 11/15 0700 In: 200 [IV Piggyback:200] Out: 2 [Urine:2] Intake/Output this shift: Total I/O In: 800 [I.V.:800] Out: -   Lab Results:  [facility] 07/01/11 0651 06/29/11 2156  WBC 6.6 9.8  HGB 11.3* 12.3  HCT 33.7* 35.8*  PLT 226 258   BMET  Basename 07/01/11 0651 06/29/11 2156  NA 142 139  K 3.1* 3.5  CL 109 105  CO2 22 24  GLUCOSE 84 90  BUN 5* 8  CREATININE 0.60 0.67  CALCIUM 8.7 9.1   LFT  Basename 07/01/11 0651  PROT 5.9*  ALBUMIN 3.2*  AST 75*  ALT 183*  ALKPHOS 131*  BILITOT 1.3*  BILIDIR --  IBILI --   PT/INR  Basename 06/30/11 1240  LABPROT 14.3  INR 1.09       Assessment / Plan: Dilated biliary tree suspicious for choledocholithiasis  Have moved ERCP to 0730- tomorrow due to backlog in OR.    Active Problems:  Abdominal  pain, other specified site  Gallbladder & bile duct stone with obstruction  Hepatitis C  Back pain     LOS: 2 days   Stan Head  07/01/2011, 4:33 PM

## 2011-07-02 NOTE — Anesthesia Preprocedure Evaluation (Addendum)
Anesthesia Evaluation  Patient identified by MRN, date of birth, ID band Patient awake    Reviewed: Allergy & Precautions, H&P , NPO status , Patient's Chart, lab work & pertinent test results  Airway Mallampati: I  Neck ROM: Full    Dental  (+) Poor Dentition, Chipped, Partial Upper and Dental Advisory Given   Pulmonary  clear to auscultation        Cardiovascular neg cardio ROS Regular Normal    Neuro/Psych    GI/Hepatic (+) C  Endo/Other  Negative Endocrine ROS  Renal/GU      Musculoskeletal   Abdominal   Peds  Hematology   Anesthesia Other Findings   Reproductive/Obstetrics                          Anesthesia Physical Anesthesia Plan  ASA: II  Anesthesia Plan: General   Post-op Pain Management:    Induction:   Airway Management Planned: Oral ETT  Additional Equipment:   Intra-op Plan:   Post-operative Plan: Extubation in OR  Informed Consent: I have reviewed the patients History and Physical, chart, labs and discussed the procedure including the risks, benefits and alternatives for the proposed anesthesia with the patient or authorized representative who has indicated his/her understanding and acceptance.   Dental advisory given  Plan Discussed with:   Anesthesia Plan Comments:         Anesthesia Quick Evaluation

## 2011-07-03 ENCOUNTER — Encounter (HOSPITAL_COMMUNITY): Payer: Self-pay | Admitting: Anesthesiology

## 2011-07-03 ENCOUNTER — Encounter (HOSPITAL_COMMUNITY)
Admission: EM | Disposition: A | Discharge status: Home / Self Care | Payer: Self-pay | Source: Ambulatory Visit | Attending: Internal Medicine

## 2011-07-03 ENCOUNTER — Encounter (HOSPITAL_COMMUNITY): Payer: Self-pay | Admitting: Certified Registered"

## 2011-07-03 ENCOUNTER — Inpatient Hospital Stay (HOSPITAL_COMMUNITY): Payer: Medicaid Other | Admitting: Anesthesiology

## 2011-07-03 ENCOUNTER — Other Ambulatory Visit (INDEPENDENT_AMBULATORY_CARE_PROVIDER_SITE_OTHER): Payer: Self-pay | Admitting: General Surgery

## 2011-07-03 HISTORY — PX: CHOLECYSTECTOMY: SHX55

## 2011-07-03 LAB — COMPREHENSIVE METABOLIC PANEL
ALT: 137 U/L — ABNORMAL HIGH (ref 0–35)
AST: 50 U/L — ABNORMAL HIGH (ref 0–37)
Alkaline Phosphatase: 108 U/L (ref 39–117)
Calcium: 8.8 mg/dL (ref 8.4–10.5)
GFR calc Af Amer: >90 mL/min (ref >90)
Glucose, Bld: 82 mg/dL (ref 70–99)
Potassium: 4 mEq/L (ref 3.5–5.1)
Sodium: 144 mEq/L (ref 135–145)
Total Protein: 6.1 g/dL (ref 6.0–8.3)

## 2011-07-03 LAB — CBC
HCT: 35.3 % — ABNORMAL LOW (ref 36.0–46.0)
Hemoglobin: 11.9 g/dL — ABNORMAL LOW (ref 12.0–15.0)
MCH: 31 pg (ref 26.0–34.0)
MCHC: 33.7 g/dL (ref 30.0–36.0)
MCV: 91.9 fL (ref 78.0–100.0)
RDW: 14.5 % (ref 11.5–15.5)

## 2011-07-03 LAB — SURGICAL PCR SCREEN: Staphylococcus aureus: POSITIVE — AB

## 2011-07-03 SURGERY — LAPAROSCOPIC CHOLECYSTECTOMY
Anesthesia: General | Site: Abdomen | Wound class: Contaminated

## 2011-07-03 SURGERY — LAPAROSCOPIC CHOLECYSTECTOMY WITH INTRAOPERATIVE CHOLANGIOGRAM
Anesthesia: General

## 2011-07-03 MED ORDER — ONDANSETRON HCL 4 MG/2ML IJ SOLN
INTRAMUSCULAR | Status: DC | PRN
  Administered 2011-07-03: 4 mg via INTRAVENOUS

## 2011-07-03 MED ORDER — MIDAZOLAM HCL 2 MG/2ML IJ SOLN
0.5000 mg | Freq: Once | INTRAMUSCULAR | Status: AC | PRN
  Administered 2011-07-03: 1 mg via INTRAVENOUS

## 2011-07-03 MED ORDER — HYDROMORPHONE HCL PF 1 MG/ML IJ SOLN
0.2500 mg | INTRAMUSCULAR | Status: DC | PRN
  Administered 2011-07-03 (×4): 0.5 mg via INTRAVENOUS

## 2011-07-03 MED ORDER — MORPHINE SULFATE 2 MG/ML IJ SOLN: 0.0500 mg/kg | INTRAMUSCULAR | Status: DC | PRN

## 2011-07-03 MED ORDER — MIDAZOLAM HCL 5 MG/5ML IJ SOLN
INTRAMUSCULAR | Status: DC | PRN
  Administered 2011-07-03: 2 mg via INTRAVENOUS

## 2011-07-03 MED ORDER — FENTANYL CITRATE 0.05 MG/ML IJ SOLN: 50.0000 ug | INTRAMUSCULAR | Status: DC | PRN

## 2011-07-03 MED ORDER — NEOSTIGMINE METHYLSULFATE 1 MG/ML IJ SOLN
INTRAMUSCULAR | Status: DC | PRN
  Administered 2011-07-03: 4 mg via INTRAVENOUS

## 2011-07-03 MED ORDER — MIDAZOLAM HCL 2 MG/2ML IJ SOLN: 1.0000 mg | INTRAMUSCULAR | Status: DC | PRN

## 2011-07-03 MED ORDER — FENTANYL CITRATE 0.05 MG/ML IJ SOLN
INTRAMUSCULAR | Status: DC | PRN
  Administered 2011-07-03: 100 ug via INTRAVENOUS
  Administered 2011-07-03 (×4): 50 ug via INTRAVENOUS
  Administered 2011-07-03 (×2): 100 ug via INTRAVENOUS
  Administered 2011-07-03: 50 ug via INTRAVENOUS

## 2011-07-03 MED ORDER — CHLORHEXIDINE GLUCONATE CLOTH 2 % EX PADS
6.0000 | MEDICATED_PAD | Freq: Every day | CUTANEOUS | Status: DC
  Administered 2011-07-03: 12 via TOPICAL

## 2011-07-03 MED ORDER — ROCURONIUM BROMIDE 100 MG/10ML IV SOLN
INTRAVENOUS | Status: DC | PRN
  Administered 2011-07-03: 10 mg via INTRAVENOUS
  Administered 2011-07-03: 40 mg via INTRAVENOUS

## 2011-07-03 MED ORDER — PNEUMOCOCCAL VAC POLYVALENT 25 MCG/0.5ML IJ INJ: 0.5000 mL | INJECTION | INTRAMUSCULAR | Status: DC

## 2011-07-03 MED ORDER — OXYCODONE-ACETAMINOPHEN 5-500 MG PO TABS: 1.0000 | ORAL_TABLET | ORAL | Status: DC | PRN

## 2011-07-03 MED ORDER — LACTATED RINGERS IV SOLN
INTRAVENOUS | Status: DC | PRN
  Administered 2011-07-03: 14:00:00 via INTRAVENOUS

## 2011-07-03 MED ORDER — MEPERIDINE HCL 25 MG/ML IJ SOLN: 6.2500 mg | INTRAMUSCULAR | Status: DC | PRN

## 2011-07-03 MED ORDER — PROMETHAZINE HCL 25 MG/ML IJ SOLN: 6.2500 mg | INTRAMUSCULAR | Status: DC | PRN

## 2011-07-03 MED ORDER — GLYCOPYRROLATE 0.2 MG/ML IJ SOLN
INTRAMUSCULAR | Status: DC | PRN
  Administered 2011-07-03: .6 mg via INTRAVENOUS

## 2011-07-03 MED ORDER — BUPIVACAINE-EPINEPHRINE 0.25% -1:200000 IJ SOLN
INTRAMUSCULAR | Status: DC | PRN
  Administered 2011-07-03: 20 mL

## 2011-07-03 MED ORDER — PROPOFOL 10 MG/ML IV EMUL
INTRAVENOUS | Status: DC | PRN
  Administered 2011-07-03: 150 mg via INTRAVENOUS

## 2011-07-03 MED ORDER — DEXTROSE-NACL 5-0.45 % IV SOLN
INTRAVENOUS | Status: DC
  Administered 2011-07-03: 20:00:00 via INTRAVENOUS

## 2011-07-03 MED ORDER — INFLUENZA VIRUS VACC SPLIT PF IM SUSP: 0.5000 mL | INTRAMUSCULAR | Status: DC

## 2011-07-03 MED ORDER — MUPIROCIN 2 % EX OINT
1.0000 "application " | TOPICAL_OINTMENT | Freq: Two times a day (BID) | CUTANEOUS | Status: DC
  Administered 2011-07-03 (×2): 1 via NASAL
  Filled 2011-07-03: qty 22

## 2011-07-03 SURGICAL SUPPLY — 41 items
APPLIER CLIP 5 13 M/L LIGAMAX5 (MISCELLANEOUS) ×3
APPLIER CLIP ROT 10 11.4 M/L (STAPLE)
BLADE SURG ROTATE 9660 (MISCELLANEOUS) ×3 IMPLANT
CANISTER SUCTION 2500CC (MISCELLANEOUS) ×3 IMPLANT
CHLORAPREP W/TINT 26ML (MISCELLANEOUS) ×3 IMPLANT
CLIP APPLIE 5 13 M/L LIGAMAX5 (MISCELLANEOUS) ×2 IMPLANT
CLIP APPLIE ROT 10 11.4 M/L (STAPLE) IMPLANT
CLOSURE STERI STRIP 1/2 X4 (GAUZE/BANDAGES/DRESSINGS) ×3 IMPLANT
CLOTH BEACON ORANGE TIMEOUT ST (SAFETY) ×3 IMPLANT
COVER MAYO STAND STRL (DRAPES) IMPLANT
COVER SURGICAL LIGHT HANDLE (MISCELLANEOUS) ×3 IMPLANT
DECANTER SPIKE VIAL GLASS SM (MISCELLANEOUS) IMPLANT
DERMABOND ADVANCED (GAUZE/BANDAGES/DRESSINGS) ×1
DERMABOND ADVANCED .7 DNX12 (GAUZE/BANDAGES/DRESSINGS) ×2 IMPLANT
DRAPE C-ARM 42X72 X-RAY (DRAPES) IMPLANT
DRAPE UTILITY 15X26 W/TAPE STR (DRAPE) ×12 IMPLANT
DRESSING OPSITE X SMALL 2X3 (GAUZE/BANDAGES/DRESSINGS) ×3 IMPLANT
ELECT REM PT RETURN 9FT ADLT (ELECTROSURGICAL) ×3
ELECTRODE REM PT RTRN 9FT ADLT (ELECTROSURGICAL) ×2 IMPLANT
GLOVE BIOGEL PI IND STRL 8 (GLOVE) ×2 IMPLANT
GLOVE BIOGEL PI INDICATOR 8 (GLOVE) ×1
GLOVE ECLIPSE 7.5 STRL STRAW (GLOVE) ×3 IMPLANT
GOWN STRL NON-REIN LRG LVL3 (GOWN DISPOSABLE) ×6 IMPLANT
KIT BASIN OR (CUSTOM PROCEDURE TRAY) ×3 IMPLANT
KIT ROOM TURNOVER OR (KITS) ×3 IMPLANT
NS IRRIG 1000ML POUR BTL (IV SOLUTION) ×3 IMPLANT
PAD ARMBOARD 7.5X6 YLW CONV (MISCELLANEOUS) ×3 IMPLANT
POUCH SPECIMEN RETRIEVAL 10MM (ENDOMECHANICALS) ×3 IMPLANT
SCISSORS LAP 5X35 DISP (ENDOMECHANICALS) IMPLANT
SET CHOLANGIOGRAPH 5 50 .035 (SET/KITS/TRAYS/PACK) IMPLANT
SET IRRIG TUBING LAPAROSCOPIC (IRRIGATION / IRRIGATOR) ×3 IMPLANT
SLEEVE ENDOPATH XCEL 5M (ENDOMECHANICALS) ×6 IMPLANT
SPECIMEN JAR SMALL (MISCELLANEOUS) ×3 IMPLANT
SUT MNCRL AB 4-0 PS2 18 (SUTURE) ×3 IMPLANT
TOWEL OR 17X24 6PK STRL BLUE (TOWEL DISPOSABLE) ×3 IMPLANT
TOWEL OR 17X26 10 PK STRL BLUE (TOWEL DISPOSABLE) ×3 IMPLANT
TRAY LAPAROSCOPIC (CUSTOM PROCEDURE TRAY) ×3 IMPLANT
TROCAR XCEL BLUNT TIP 100MML (ENDOMECHANICALS) ×3 IMPLANT
TROCAR XCEL NON-BLD 11X100MML (ENDOMECHANICALS) IMPLANT
TROCAR XCEL NON-BLD 5MMX100MML (ENDOMECHANICALS) ×3 IMPLANT
WATER STERILE IRR 1000ML POUR (IV SOLUTION) IMPLANT

## 2011-07-03 NOTE — Transfer of Care (Signed)
Immediate Anesthesia Transfer of Care Note  Patient: Cathy Santos  Procedure(s) Performed:  LAPAROSCOPIC CHOLECYSTECTOMY  Patient Location: PACU  Anesthesia Type: General  Level of Consciousness: awake, alert , oriented and patient cooperative  Airway & Oxygen Therapy: Patient Spontanous Breathing and Patient connected to nasal cannula oxygen  Post-op Assessment: Report given to PACU RN, Post -op Vital signs reviewed and stable and Patient moving all extremities X 4  Post vital signs: Reviewed and stable  Complications: No apparent anesthesia complications

## 2011-07-03 NOTE — Anesthesia Procedure Notes (Signed)
Procedure Name: Intubation Date/Time: 07/03/2011 2:27 PM Performed by: Einar Crow Pre-anesthesia Checklist: Patient identified, Emergency Drugs available, Suction available and Patient being monitored Patient Re-evaluated:Patient Re-evaluated prior to inductionOxygen Delivery Method: Circle System Utilized Preoxygenation: Pre-oxygenation with 100% oxygen Intubation Type: IV induction Ventilation: Mask ventilation without difficulty Laryngoscope Size: Mac and 3 Grade View: Grade I Tube type: Oral Tube size: 7.5 mm Number of attempts: 1 Airway Equipment and Method: stylet Placement Confirmation: ETT inserted through vocal cords under direct vision,  positive ETCO2 and breath sounds checked- equal and bilateral Secured at: 21 cm Tube secured with: Tape Dental Injury: Teeth and Oropharynx as per pre-operative assessment

## 2011-07-03 NOTE — Preoperative (Signed)
Beta Blockers   Reason not to administer Beta Blockers:Not Applicable 

## 2011-07-03 NOTE — Interval H&P Note (Signed)
History and Physical Interval Note:   07/03/2011   2:12 PM   Cathy Santos  has presented today for surgery, with the diagnosis of gallstones  The various methods of treatment have been discussed with the patient and family. After consideration of risks, benefits and other options for treatment, the patient has consented to  Procedure(s): LAPAROSCOPIC CHOLECYSTECTOMY WITH INTRAOPERATIVE CHOLANGIOGRAM as a surgical intervention .  The patients' history has been reviewed, patient examined, no change in status, stable for surgery.  I have reviewed the patients' chart and labs.  Questions were answered to the patient's satisfaction.     [name] Rene Kocher  MD

## 2011-07-03 NOTE — H&P (View-Only) (Signed)
  1 Day Post-Op  1 Day Post-Op  Subjective: Understands that she will be having a cholecystectomy performed.   Objective: Vital signs in last 24 hours: Temp:  [97.4 F (36.3 C)-98.1 F (36.7 C)] 97.7 F (36.5 C) (11/17 0554) Pulse Rate:  [56-76] 64  (11/17 0554) Resp:  [18-62] 18  (11/17 0554) BP: (100-135)/(57-85) 105/66 mmHg (11/17 0554) SpO2:  [99 %-100 %] 99 % (11/17 0554)   Intake/Output from previous day: 11/16 0701 - 11/17 0700 In: 2932 [I.V.:2732; IV Piggyback:200] Out: -  Intake/Output this shift: Total I/O In: 200 [IV Piggyback:200] Out: -   not performed  Lab Results:   Basename 07/03/11 0730 07/02/11 0519  WBC 7.9 7.3  HGB 11.9* 11.5*  HCT 35.3* 34.7*  PLT 237 242   BMET  Basename 07/03/11 0730 07/01/11 0651  NA PENDING 142  K PENDING 3.1*  CL PENDING 109  CO2 25 22  GLUCOSE 82 84  BUN 5* 5*  CREATININE 0.64 0.60  CALCIUM 8.8 8.7   PT/INR  Basename 06/30/11 1240  LABPROT 14.3  INR 1.09    Studies/Results: Dg Ercp With Sphincterotomy  07/03/2011  *RADIOLOGY REPORT*  Clinical Data: Cholelithiasis.  Biliary dilatation.  ERCP  Comparison:  None.  Technique:  Multiple spot images obtained with the fluoroscopic device and submitted for interpretation post-procedure.  ERCP was performed by Dr. Gessner.  Findings: Contrast injection into the common bile duct shows mild dilatation of the common bile duct and the intrahepatic bile ducts. No definite filling defects are visualized.  Subsequent images show passage of a balloon occlusion catheter, and no residual calculi are identified within the common bile duct.  IMPRESSION: Mild biliary ductal dilatation.  No residual biliary calculi identified.  These images were submitted for radiologic interpretation only. Please see the procedural report for the amount of contrast and the fluoroscopy time utilized.  Original Report Authenticated By: JOHN A. STAHL, M.D.    Anti-infectives: Anti-infectives     Start      Dose/Rate Route Frequency Ordered Stop   07/02/11 1932   ceFAZolin (ANCEF) IVPB 1 g/50 mL premix        1 g 100 mL/hr over 30 Minutes Intravenous 60 min pre-op 07/02/11 1932     06/30/11 0915   ciprofloxacin (CIPRO) IVPB 400 mg        400 mg 200 mL/hr over 60 Minutes Intravenous Every 12 hours 06/30/11 0903            Assessment/Plan: NPO for possible surgeyr 1 Day Post-Op    LOS: 4 days    Matt B. Katiana Ruland, MD, FACS  [facility] [phone] beeper [phone]  07/03/2011 9:35 AM   

## 2011-07-03 NOTE — Anesthesia Preprocedure Evaluation (Addendum)
Anesthesia Evaluation  Patient identified by MRN, date of birth, ID band Patient awake    Reviewed: Allergy & Precautions, H&P , NPO status , Patient's Chart, lab work & pertinent test results, reviewed documented beta blocker date and time   Airway Mallampati: I TM Distance: >3 FB Neck ROM: Full    Dental  (+) Poor Dentition   Pulmonary          Cardiovascular     Neuro/Psych    GI/Hepatic (+) Hepatitis -, C  Endo/Other    Renal/GU      Musculoskeletal   Abdominal   Peds  Hematology   Anesthesia Other Findings   Reproductive/Obstetrics                           Anesthesia Physical Anesthesia Plan  ASA: III  Anesthesia Plan: General   Post-op Pain Management:    Induction: Intravenous  Airway Management Planned: Oral ETT  Additional Equipment:   Intra-op Plan:   Post-operative Plan: Extubation in OR  Informed Consent: I have reviewed the patients History and Physical, chart, labs and discussed the procedure including the risks, benefits and alternatives for the proposed anesthesia with the patient or authorized representative who has indicated his/her understanding and acceptance.   Dental advisory given  Plan Discussed with: CRNA  Anesthesia Plan Comments:         Anesthesia Quick Evaluation

## 2011-07-03 NOTE — Anesthesia Postprocedure Evaluation (Signed)
  Anesthesia Post-op Note  Patient: Cathy Santos  Procedure(s) Performed:  LAPAROSCOPIC CHOLECYSTECTOMY  Patient Location: PACU  Anesthesia Type: General  Level of Consciousness: awake  Airway and Oxygen Therapy: Patient Spontanous Breathing  Post-op Pain: mild  Post-op Assessment: Post-op Vital signs reviewed  Post-op Vital Signs: stable  Complications: No apparent anesthesia complications

## 2011-07-03 NOTE — Progress Notes (Signed)
Subjective:  still with abdominal pain , for lap chole today.  Objective: Vital signs in last 24 hours: Filed Vitals:   07/02/11 2110 07/03/11 0149 07/03/11 0554 07/03/11 1000  BP: 102/57 107/68 105/66 116/67  Pulse: 71 65 64 61  Temp: 97.8 F (36.6 C) 98.1 F (36.7 C) 97.7 F (36.5 C) 97.7 F (36.5 C)  TempSrc: Oral Oral Oral Oral  Resp: 18 20 18 18   Height:      Weight:      SpO2: 99% 100% 99% 100%   Weight change:   Intake/Output Summary (Last 24 hours) at 07/03/11 1318 Last data filed at 07/03/11 0853  Gross per 24 hour  Intake   2232 ml  Output      0 ml  Net   2232 ml    BP 116/67  Pulse 61  Temp(Src) 97.7 F (36.5 C) (Oral)  Resp 18  Ht 5\' 4"  (1.626 m)  Wt 61.19 kg (134 lb 14.4 oz)  BMI 23.16 kg/m2  SpO2 100%  LMP 06/22/2011  General Appearance:    Alert, cooperative, no distress, appears stated age  Head:    Normocephalic, without obvious abnormality, atraumatic  Eyes:    PERRL, conjunctiva/corneas clear, EOM's intact, fundi    benign, both eyes  Ears:    Normal TM's and external ear canals, both ears  Nose:   Nares normal, septum midline, mucosa normal, no drainage    or sinus tenderness  Throat:   Lips, mucosa, and tongue normal; teeth and gums normal  Neck:   Supple, symmetrical, trachea midline, no adenopathy;    thyroid:  no enlargement/tenderness/nodules; no carotid   bruit or JVD  Back:     Symmetric, no curvature, ROM normal, no CVA tenderness  Lungs:     Clear to auscultation bilaterally, respirations unlabored  Chest Wall:    No tenderness or deformity   Heart:    Regular rate and rhythm, S1 and S2 normal, no murmur, rub   or gallop  Breast Exam:    No tenderness, masses, or nipple abnormality  Abdomen:     Soft, mild-tender, bowel sounds active all four quadrants,    no masses, no organomegaly  Genitalia:    Normal female without lesion, discharge or tenderness  Rectal:    Normal tone, normal prostate, no masses or tenderness;   guaiac  negative stool  Extremities:   Extremities normal, atraumatic, no cyanosis or edema  Pulses:   2+ and symmetric all extremities  Skin:   Skin color, texture, turgor normal, no rashes or lesions  Lymph nodes:   Cervical, supraclavicular, and axillary nodes normal  Neurologic:   CNII-XII intact, normal strength, sensation and reflexes    throughout    Lab Results:  Basename 07/03/11 0730 07/01/11 0651  NA 144 142  K 4.0 3.1*  CL 109 109  CO2 25 22  GLUCOSE 82 84  BUN 5* 5*  CREATININE 0.64 0.60  CALCIUM 8.8 8.7  MG -- --  PHOS -- --    Basename 07/03/11 0730 07/01/11 0651  AST 50* 75*  ALT 137* 183*  ALKPHOS 108 131*  BILITOT 0.7 1.3*  PROT 6.1 5.9*  ALBUMIN 3.3* 3.2*   No results found for this basename: LIPASE:2,AMYLASE:2 in the last 72 hours  Basename 07/03/11 0730 07/02/11 0519  WBC 7.9 7.3  NEUTROABS -- --  HGB 11.9* 11.5*  HCT 35.3* 34.7*  MCV 91.9 92.5  PLT 237 242   Micro Results: Recent Results (  from the past 240 hour(s))  SURGICAL PCR SCREEN     Status: Abnormal   Collection Time   07/02/11  9:39 PM      Component Value Range Status Comment   MRSA, PCR NEGATIVE  NEGATIVE  Final    Staphylococcus aureus POSITIVE (*) NEGATIVE  Final     Studies/Results: US Abdomen Complete  06/30/2011  *RADIOLOGY REPORT*  Clinical Data:  Abdominal pain; increased LFTs.  ABDOMINAL ULTRASOUND COMPLETE  Comparison:  Abdominal ultrasound performed 04/08/2010  Findings:  Gallbladder: The gallbladder is somewhat contracted, with multiple small stones.  There is relatively diffuse wall thickening along the gallbladder, to 0.4 cm.  No ultrasonographic Murphy's sign is elicited, though the patient is partially sedated.  This could simply reflect a contracted gallbladder, though cholecystitis cannot be entirely excluded.  Common Bile Duct:  1.0 cm in diameter; dilated to the level of the pancreatic head, raising concern for distal obstruction.  Liver:  Grossly normal parenchymal  echogenicity and echotexture; no focal lesions identified.  Mild nodularity to the hepatic contour could reflect mild cirrhotic change.  Limited Doppler evaluation demonstrates normal blood flow within the liver. Intrahepatic biliary ductal dilatation is noted; this may explain the patient's elevated LFTs.  IVC:  Unremarkable in appearance.  Pancreas:  Although the pancreas is difficult to visualize in its entirety due to overlying bowel gas, no focal pancreatic abnormality is identified.  Spleen:  10.1 cm in length; within normal limits in size and echotexture.  Right kidney:  12.2 cm in length; normal in size, configuration and parenchymal echogenicity.  No evidence of mass or hydronephrosis.  Left kidney:  12.8 cm in length; normal in size, configuration and parenchymal echogenicity.  No evidence of mass or hydronephrosis.  Abdominal Aorta:  Normal in caliber; no aneurysm identified.  IMPRESSION:  1.  Dilatation of the intrahepatic biliary ducts and common hepatic duct; the common hepatic duct measures 1.0 cm in diameter.  This raises concern for distal obstruction. 2.  Somewhat contracted gallbladder, with multiple small stones. Relatively diffuse gallbladder wall thickening may simply reflect a contracted gallbladder, though cholecystitis cannot be entirely excluded. 3.  Mild nodularity to the hepatic contour could reflect mild cirrhotic change; liver otherwise unremarkable in appearance.  Original Report Authenticated By: Tonia Ghent, M.D.   Ct Abdomen Pelvis W Contrast  06/30/2011  *RADIOLOGY REPORT*  Clinical Data: Pain, history hepatitis C  CT ABDOMEN AND PELVIS WITH CONTRAST  Technique:  Multidetector CT imaging of the abdomen and pelvis was performed following the standard protocol during bolus administration of intravenous contrast.  Contrast: 80mL OMNIPAQUE IOHEXOL 300 MG/ML IV SOLN  Comparison: Ultrasound abdomen of 06/30/2011  Findings: The lung bases are clear.  The liver is inhomogeneous as  noted on recent ultrasound, possibly indicating mild changes of cirrhosis.  There is no significant nodularity to the periphery of the liver noted.  There is slight prominence of the central intrahepatic ducts and the common bile duct which measures up to 10 mm in diameter.  The gallbladder is somewhat thick-walled but partially contracted and there is some higher attenuation debris within the gallbladder which may represent sludge and/or noncalcified gallstones.  On the coronal images the common bile duct is prominent, tapering distally.  This could indicate either distal common bile duct non visualized calculus, stricture, or mass.  No definite soft tissue mass is seen.  ERCP may be helpful to assess further.  The pancreatic parenchyma is unremarkable with no evidence of mass and no pancreatic  ductal dilatation is seen. The adrenal glands and spleen are unremarkable.  The stomach is not well distended.  The kidneys enhance with no calculus or mass and no hydronephrosis is seen.  The abdominal aorta is normal in caliber.  No adenopathy is seen, with only small retroperitoneal nodes present.  The uterus is normal in size.  There are right ovarian cysts present.  No significant free fluid is seen within the pelvis. Only tiny left ovarian cysts are noted.  The urinary bladder is unremarkable.  The appendix and terminal ileum are unremarkable. No abnormality of the colon is seen.  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  Normal alignment is maintained.  IMPRESSION:  1.  Prominent central intrahepatic ducts and common bile duct to the distal common bile duct where there is tapering.  Consider distal common bile duct nonvisualized calculus, stricture, or less likely mass.  ERCP may be helpful. 2.  The gallbladder wall is somewhat thickened and there is some debris within the gallbladder consistent with sludge and/or noncalcified gallstones. 3.  Somewhat inhomogeneous liver may represent changes of cirrhosis as  questioned by recent ultrasound. 4.  Several right ovarian cyst. 5.  Bilateral pars defects at L5.  Original Report Authenticated By: Juline Patch, M.D.   Dg Ercp With Sphincterotomy  07/03/2011  *RADIOLOGY REPORT*  Clinical Data: Cholelithiasis.  Biliary dilatation.  ERCP  Comparison:  None.  Technique:  Multiple spot images obtained with the fluoroscopic device and submitted for interpretation post-procedure.  ERCP was performed by Dr. Leone Payor.  Findings: Contrast injection into the common bile duct shows mild dilatation of the common bile duct and the intrahepatic bile ducts. No definite filling defects are visualized.  Subsequent images show passage of a balloon occlusion catheter, and no residual calculi are identified within the common bile duct.  IMPRESSION: Mild biliary ductal dilatation.  No residual biliary calculi identified.  These images were submitted for radiologic interpretation only. Please see the procedural report for the amount of contrast and the fluoroscopy time utilized.  Original Report Authenticated By: Danae Orleans, M.D.    Medications: I have reviewed the patient's current medications. Scheduled Meds:   . ceFAZolin (ANCEF) IV  1 g Intravenous 60 min Pre-Op  . Chlorhexidine Gluconate Cloth  6 each Topical Daily  . ciprofloxacin  400 mg Intravenous Q12H  . heparin subcutaneous  5,000 Units Subcutaneous Q8H  . influenza  inactive virus vaccine  0.5 mL Intramuscular Tomorrow-1000  . mupirocin ointment  1 application Nasal BID  . nicotine  21 mg Transdermal Daily  . pantoprazole (PROTONIX) IV  40 mg Intravenous Q supper  . pneumococcal 23 valent vaccine  0.5 mL Intramuscular Tomorrow-1000  . DISCONTD: influenza  inactive virus vaccine  0.5 mL Intramuscular Tomorrow-1000  . DISCONTD: pneumococcal 23 valent vaccine  0.5 mL Intramuscular Tomorrow-1000   Continuous Infusions:   . sodium chloride 100 mL/hr at 07/03/11 0853   PRN Meds:.clonazePAM, morphine, ondansetron,  oxyCODONE-acetaminophen, DISCONTD: morphine, DISCONTD: oxyCODONE-acetaminophen  Assessment/Plan: Active Problems:  1- RIGHT hypochondrium pain from gall stone pain , ERCP negative, for lap chole today , patient still NPO 2-ABNORMAL LFT: likely passed stone. trending down 3- Hepatitis C 4- Back pain( chronic)     LOS: 4 days  Cathy Santos I. 07/03/2011, 1:18 PM

## 2011-07-03 NOTE — Progress Notes (Signed)
  1 Day Post-Op  1 Day Post-Op  Subjective: Understands that she will be having a cholecystectomy performed.   Objective: Vital signs in last 24 hours: Temp:  [97.4 F (36.3 C)-98.1 F (36.7 C)] 97.7 F (36.5 C) (11/17 0554) Pulse Rate:  [56-76] 64  (11/17 0554) Resp:  [18-62] 18  (11/17 0554) BP: (100-135)/(57-85) 105/66 mmHg (11/17 0554) SpO2:  [99 %-100 %] 99 % (11/17 0554)   Intake/Output from previous day: 11/16 0701 - 11/17 0700 In: 2932 [I.V.:2732; IV Piggyback:200] Out: -  Intake/Output this shift: Total I/O In: 200 [IV Piggyback:200] Out: -   not performed  Lab Results:   [facility] 07/03/11 0730 07/02/11 0519  WBC 7.9 7.3  HGB 11.9* 11.5*  HCT 35.3* 34.7*  PLT 237 242   BMET  Basename 07/03/11 0730 07/01/11 0651  NA PENDING 142  K PENDING 3.1*  CL PENDING 109  CO2 25 22  GLUCOSE 82 84  BUN 5* 5*  CREATININE 0.64 0.60  CALCIUM 8.8 8.7   PT/INR  Basename 06/30/11 1240  LABPROT 14.3  INR 1.09    Studies/Results: Dg Ercp With Sphincterotomy  07/03/2011  *RADIOLOGY REPORT*  Clinical Data: Cholelithiasis.  Biliary dilatation.  ERCP  Comparison:  None.  Technique:  Multiple spot images obtained with the fluoroscopic device and submitted for interpretation post-procedure.  ERCP was performed by Dr. Leone Payor.  Findings: Contrast injection into the common bile duct shows mild dilatation of the common bile duct and the intrahepatic bile ducts. No definite filling defects are visualized.  Subsequent images show passage of a balloon occlusion catheter, and no residual calculi are identified within the common bile duct.  IMPRESSION: Mild biliary ductal dilatation.  No residual biliary calculi identified.  These images were submitted for radiologic interpretation only. Please see the procedural report for the amount of contrast and the fluoroscopy time utilized.  Original Report Authenticated By: Danae Orleans, M.D.    Anti-infectives: Anti-infectives     Start      Dose/Rate Route Frequency Ordered Stop   07/02/11 1932   ceFAZolin (ANCEF) IVPB 1 g/50 mL premix        1 g 100 mL/hr over 30 Minutes Intravenous 60 min pre-op 07/02/11 1932     06/30/11 0915   ciprofloxacin (CIPRO) IVPB 400 mg        400 mg 200 mL/hr over 60 Minutes Intravenous Every 12 hours 06/30/11 0903            Assessment/Plan: NPO for possible surgeyr 1 Day Post-Op    LOS: 4 days    Matt B. Daphine Deutscher, MD, [facility] [phone] beeper [phone]  07/03/2011 9:35 AM

## 2011-07-04 MED ORDER — OXYCODONE-ACETAMINOPHEN 5-500 MG PO TABS: 1.0000 | ORAL_TABLET | Freq: Four times a day (QID) | ORAL | Status: DC | PRN

## 2011-07-04 NOTE — Progress Notes (Signed)
[facility] Progress Note:   1 Day Post-Op  Subjective: Sleepy and hasn't gotten up much.   Objective: Vital signs in last 24 hours: Temp:  [97.6 F (36.4 C)-98.6 F (37 C)] 98.6 F (37 C) (11/18 0600) Pulse Rate:  [59-96] 96  (11/18 0600) Resp:  [11-29] 18  (11/18 0600) BP: (107-132)/(65-77) 115/65 mmHg (11/18 0600) SpO2:  [94 %-100 %] 94 % (11/18 0600)  Intake/Output from previous day: 11/17 0701 - 11/18 0700 In: 3683 [I.V.:3483; IV Piggyback:200] Out: 3175 [Urine:3150; Blood:25] Intake/Output this shift: Total I/O In: 200 [IV Piggyback:200] Out: 800 [Urine:800]  Physical Exam:  Not tender Lab Results:   Basename 07/03/11 0730 07/02/11 0519  WBC 7.9 7.3  HGB 11.9* 11.5*  HCT 35.3* 34.7*  PLT 237 242   BMET  Basename 07/03/11 0730  NA 144  K 4.0  CL 109  CO2 25  GLUCOSE 82  BUN 5*  CREATININE 0.64  CALCIUM 8.8   PT/INR No results found for this basename: LABPROT:2,INR:2 in the last 72 hours Studies/Results: No results found. Anti-infectives: Anti-infectives     Start     Dose/Rate Route Frequency Ordered Stop   07/02/11 1932   ceFAZolin (ANCEF) IVPB 1 g/50 mL premix        1 g 100 mL/hr over 30 Minutes Intravenous 60 min pre-op 07/02/11 1932 07/03/11 1443   06/30/11 0915   ciprofloxacin (CIPRO) IVPB 400 mg        400 mg 200 mL/hr over 60 Minutes Intravenous Every 12 hours 06/30/11 1610            Assessment/Plan: Problem List: Patient Active Problem List  Diagnoses  . Abdominal  pain, other specified site  . Gallbladder & bile duct stone with obstruction  . Hepatitis C  . Back pain    Doing well after lap chole per Dr. Lindie Spruce.  OK for discharge from our part.  1 Day Post-Op    LOS: 5 days   Matt B. Daphine Deutscher, MD, [facility] [phone] beeper [phone]  07/04/2011 9:41 AM

## 2011-07-04 NOTE — Progress Notes (Signed)
Iv dc'd. Home meds reviewed. Prescription given. Home care instructions given. Follow up appt. to be made. Activity and diet discussed.  Incisional care gone over, s/s of infections gone over. Preprinted instructions given to patient. Patient verbalized understanding of all.

## 2011-07-04 NOTE — Discharge Summary (Signed)
DISCHARGE SUMMARY  Cathy Santos  MR#: [id number]  DOB:18-Aug-1975  Date of Admission: 06/29/2011 Date of Discharge: 07/04/2011  Attending Physician:Sameen Leas I.  Patient's WUJ:WJXBJ,YNWGN, MD  Consults:Treatment Team:  Alissah Redmon I. Jonathon Jordan, MD  Luretha Murphy Discharge Diagnoses: #1 right hypochondrium pain felt to be secondary to gallbladder stone sp Lapcholecystectomy #2 abnormal LFTs status post ERCP/sphinerectomy/no stone extracted from the common bile duct. Possible passed stone #3 history of hep C #4 chronic back pain on chronic Percocet Procedures: . Ct abd and pelvis Prominent central intrahepatic ducts and common bile duct to  the distal common bile duct where there is tapering. Consider  distal common bile duct nonvisualized calculus, stricture, or less  likely mass. ERCP may be helpful.  2. The gallbladder wall is somewhat thickened and there is some  debris within the gallbladder consistent with sludge and/or  noncalcified gallstones.  3. Somewhat inhomogeneous liver may represent changes of cirrhosis  as questioned by recent ultrasound.  4. Several right ovarian cyst.  5. Bilateral pars defects at L5. [place] OF abdomin . Dilatation of the intrahepatic biliary ducts and common hepatic  duct; the common hepatic duct measures 1.0 cm in diameter. This  raises concern for distal obstruction.  2. Somewhat contracted gallbladder, with multiple small stones.  Relatively diffuse gallbladder wall thickening may simply reflect a  contracted gallbladder, though cholecystitis cannot be entirely  excluded.  3. Mild nodularity to the hepatic contour could reflect mild  cirrhotic change; liver otherwise unremarkable in appearance  Current Discharge Medication List    CONTINUE these medications which have CHANGED   Details  oxycodone-acetaminophen (ROXICET) 5-500 MG per tablet Take 1 tablet by mouth every 6 (six) hours as needed for pain. For pain. Qty: 30  tablet, Refills: 0      CONTINUE these medications which have NOT CHANGED   Details  clonazePAM (KLONOPIN) 0.5 MG tablet Take 0.5 mg by mouth 2 (two) times daily as needed. For anxiety.    esomeprazole (NEXIUM) 40 MG capsule Take 40 mg by mouth daily before breakfast.            Hospital Course: This is a 35 year old female, with history of hepatitis C, chronic back pain presented to the emergency room with right hypochondrium and epigastric pain, she was evaluated by Dr. August Luz her physician felt it could be gastritis and Nexium prescribed, patient did not feel any better, presented to the emergency room with a sharp pain radiating into her back, she stated eating for the spicy or greasy would make her worse, on the emergency room ultrasound of the abdomen did show medication of the into the biliary duct concern for distal obstruction, gastroenterology consulted and patient underwent ERCP ERCP done the patient is status post synovectomy, surgical to consulted and patient underwent laparoscopic cholecystectomy yesterday. The patient did very well. Patient needs to follow up with the surgeon as outpatient within 3 weeks and followup with Dr. August Luz, Coumadin diet.  Day of Discharge BP 115/65  Pulse 96  Temp(Src) 98.6 F (37 C) (Oral)  Resp 18  Ht 5\' 4"  (1.626 m)  Wt 61.19 kg (134 lb 14.4 oz)  BMI 23.16 kg/m2  SpO2 94%  LMP 06/22/2011  Physical Exam: Patient lying comfortably on bed, not on respiratory distress are low with some pain after laparoscopic cholecystectomy, Lung examination normal vesicular breathing with equal air entry Abdomen mild tendernessbutsoft bowel sounds present Extremities without edema  No results found for this or any previous visit (from  the past 24 hour(s)).  Disposition:home   Follow-up Appts: Discharge Orders    Future Orders Please Complete By Expires   Diet general      Scheduling Instructions:   increase you diet  From full diet to regular  diet   Increase activity slowly      Activity as tolerated - No restrictions         Follow-up with Dr Mikeal Hawthorne one week.  Rosalee Kaufman  WEEK call for appointment  Signed: Etherine Mackowiak I. 07/04/2011, 10:19 AM

## 2011-07-06 ENCOUNTER — Encounter (HOSPITAL_COMMUNITY): Payer: Self-pay | Admitting: Internal Medicine

## 2011-07-22 ENCOUNTER — Telehealth (INDEPENDENT_AMBULATORY_CARE_PROVIDER_SITE_OTHER): Payer: Self-pay | Admitting: General Surgery

## 2011-08-24 ENCOUNTER — Encounter (INDEPENDENT_AMBULATORY_CARE_PROVIDER_SITE_OTHER): Payer: Medicaid Other | Admitting: General Surgery

## 2011-08-24 DIAGNOSIS — Z09 Encounter for follow-up examination after completed treatment for conditions other than malignant neoplasm: Secondary | ICD-10-CM | POA: Insufficient documentation

## 2011-08-25 NOTE — Op Note (Signed)
OPERATIVE REPORT  DATE OF OPERATION: 06/29/2011 - 07/03/2011  PATIENT:  Cathy Santos  36 y.Santos. female  PRE-OPERATIVE DIAGNOSIS:  cholelithiasis  POST-OPERATIVE DIAGNOSIS:  cholelithiasis  PROCEDURE:  Procedure(s): LAPAROSCOPIC CHOLECYSTECTOMY  SURGEON:  Surgeon(s): Jetty Duhamel, MD  ASSISTANTOrson Slick, RNFA  ANESTHESIA:   general  EBL: 20 ml  BLOOD ADMINISTERED: none  DRAINS: none   SPECIMEN:  Source of Specimen:  Gallbladder and stones  COUNTS CORRECT:  YES  PROCEDURE DETAILS: The patient was taken to the operating room and placed on the table in the supine position. After an adequate time out was performed identifying the patient and the procedure to be performed she was prepped and draped in usual sterile manner exposing the entire abdomen.  A supraumbilical midline incision was made using a #15 blade. We dissected down to the midline fascia then nicked the fascia with a 15 blade. We grabbed the edges were Coker clamps and bluntly dissected down into the peritoneal cavity with a Kelly clamp. Once that was done a pursestring suture of 0 Vicryl was passed around the fascial opening and the Hassan cannula was passed into the peritoneal cavity with minimal difficulty. It was secured in place with a pursestring suture. Once this was done carbon dioxide gas was insufflated into the peritoneal cavity up to maximal intra-abdominal pressure of 15 mm of mercury.  2 right upper quadrant 5 mm cannulas in the subxiphoid 5 mm cannula were passed under direct vision into the peritoneal cavity. We then began the dissection by placing the patient in reverse Trendelenburg left side was tilted down. We dissected out the peritoneum overlying the triangle of flow and the hepatoduodenal triangle. The cystic duct and cystic artery were identified. The cystic duct and cystic arteries were both approximately distally". We then transected the cystic duct followed by the cystic artery with minimal  difficulty. We dissected out the gallbladder from its bed with minimal difficulty. Using an Endo Catch bag to retrieve the gallbladder from his subumbilical site. Once this was done we inspected the bed for hemostasis. Electrocautery was used to provide adequate hemostasis. Once this was done we aspirated all fluid and gas around the liver in close.  The fascia the suprabuccal site was closed using a pursestring suture which was in place. We subsequently removed all of the cannulas. We injected quarter percent Marcaine with epi at all sites. We closed the subumbilical site using a running subcuticular stitch of 4-0 Monocryl. All other sites were closed with Dermabond. Dermabond Steri-Strips and Tegaderm were used to complete our dressings all needle counts sponge counts and instrument counts were correct.  PATIENT DISPOSITION:  PACU - hemodynamically stable.   Cathy Santos,Cathy Santos 1/9/20136:09 PM

## 2011-09-03 ENCOUNTER — Encounter (INDEPENDENT_AMBULATORY_CARE_PROVIDER_SITE_OTHER): Payer: Self-pay | Admitting: General Surgery

## 2012-04-08 ENCOUNTER — Emergency Department (INDEPENDENT_AMBULATORY_CARE_PROVIDER_SITE_OTHER)
Admission: EM | Admit: 2012-04-08 | Discharge: 2012-04-08 | Disposition: A | Discharge status: Home / Self Care | Payer: Medicaid Other | Source: Home / Self Care | Attending: Family Medicine | Admitting: Family Medicine

## 2012-04-08 ENCOUNTER — Encounter (HOSPITAL_COMMUNITY): Payer: Self-pay

## 2012-04-08 DIAGNOSIS — K0889 Other specified disorders of teeth and supporting structures: Secondary | ICD-10-CM

## 2012-04-08 DIAGNOSIS — K089 Disorder of teeth and supporting structures, unspecified: Secondary | ICD-10-CM

## 2012-04-08 MED ORDER — CLINDAMYCIN HCL 150 MG PO CAPS: 150.0000 mg | ORAL_CAPSULE | Freq: Four times a day (QID) | ORAL | Status: AC

## 2012-04-08 MED ORDER — DICLOFENAC POTASSIUM 50 MG PO TABS: 50.0000 mg | ORAL_TABLET | Freq: Three times a day (TID) | ORAL | Status: DC

## 2012-04-08 NOTE — ED Notes (Signed)
Pt has lt sided upper toothache that started three days ago, she has multiple dental caries with decay to the gumline.

## 2012-04-08 NOTE — ED Provider Notes (Signed)
History     CSN: [id number]  Arrival date & time 04/08/12  1121   First MD Initiated Contact with Patient 04/08/12 1125      Chief Complaint  Patient presents with  . Dental Pain    (Consider location/radiation/quality/duration/timing/severity/associated sxs/prior treatment) Patient is a 36 y.o. female presenting with tooth pain. The history is provided by the patient.  Dental PainThe primary symptoms include mouth pain. The symptoms began 3 to 5 days ago. The symptoms are worsening. The symptoms are chronic. The symptoms occur constantly.  Additional symptoms include: dental sensitivity to temperature, gum tenderness and purulent gums. Medical issues include: periodontal disease.    Past Medical History  Diagnosis Date  . Ulcer   . Hep C w/o coma, chronic     Past Surgical History  Procedure Date  . Tubal ligation   . Ercp 07/02/2011    Procedure: ENDOSCOPIC RETROGRADE CHOLANGIOPANCREATOGRAPHY (ERCP);  Surgeon: Iva Boop, MD;  Location: [facility] OR;  Service: Gastroenterology;  Laterality: N/A;  . Cholecystectomy 07/03/2011    Procedure: LAPAROSCOPIC CHOLECYSTECTOMY;  Surgeon: Jetty Duhamel, MD;  Location: [facility] OR;  Service: General;  Laterality: N/A;    Family History  Problem Relation Age of Onset  . Cancer Mother   . Diabetes Father   . Heart failure Father   . Hypertension Father   . Stroke Father     History  Substance Use Topics  . Smoking status: Current Everyday Smoker  . Smokeless tobacco: Not on file  . Alcohol Use: No    OB History    Grav Para Term Preterm Abortions TAB SAB Ect Mult Living                  Review of Systems  Constitutional: Negative.   HENT: Positive for dental problem.     Allergies  Hydrocodone and Amoxicillin  Home Medications   Current Outpatient Rx  Name Route Sig Dispense Refill  . CLINDAMYCIN HCL 150 MG PO CAPS Oral Take 1 capsule (150 mg total) by mouth every 6 (six) hours. 28 capsule 0  . CLONAZEPAM 0.5 MG  PO TABS Oral Take 0.5 mg by mouth 2 (two) times daily as needed. For anxiety.    Marland Kitchen DICLOFENAC POTASSIUM 50 MG PO TABS Oral Take 1 tablet (50 mg total) by mouth 3 (three) times daily. 15 tablet 0  . ESOMEPRAZOLE MAGNESIUM 40 MG PO CPDR Oral Take 40 mg by mouth daily before breakfast.      . OXYCODONE-ACETAMINOPHEN 5-500 MG PO TABS Oral Take 1 tablet by mouth every 6 (six) hours as needed for pain. For pain. 30 tablet 0    BP 118/79  Pulse 66  Temp 97.8 F (36.6 C) (Oral)  Resp 20  SpO2 100%  LMP 03/27/2012  Physical Exam  Nursing note and vitals reviewed. Constitutional: She appears well-developed and well-nourished. She appears distressed.  HENT:  Head: Normocephalic.  Mouth/Throat: Oropharynx is clear and moist.      ED Course  Dental Date/Time: 04/08/2012 12:33 PM Performed by: Linna Hoff Authorized by: Bradd Canary D Consent: Verbal consent obtained. Consent given by: patient Patient understanding: patient states understanding of the procedure being performed Local anesthesia used: yes Anesthesia: local infiltration Local anesthetic: bupivacaine 0.5% without epinephrine Patient sedated: no Patient tolerance: Patient tolerated the procedure well with no immediate complications. Comments: Dental pain relief with inj.   (including critical care time)  Labs Reviewed - No data to display No results found.  1. Pain, dental       MDM  Dental block performed.        Linna Hoff, MD 04/08/12 [phone]

## 2012-05-12 ENCOUNTER — Encounter (HOSPITAL_COMMUNITY): Payer: Self-pay | Admitting: Adult Health

## 2012-05-12 ENCOUNTER — Emergency Department (HOSPITAL_COMMUNITY)
Admission: EM | Admit: 2012-05-12 | Discharge: 2012-05-13 | Disposition: A | Discharge status: Home / Self Care | Payer: Medicaid Other | Attending: Emergency Medicine | Admitting: Emergency Medicine

## 2012-05-12 DIAGNOSIS — F1110 Opioid abuse, uncomplicated: Secondary | ICD-10-CM

## 2012-05-12 DIAGNOSIS — Z9089 Acquired absence of other organs: Secondary | ICD-10-CM | POA: Insufficient documentation

## 2012-05-12 DIAGNOSIS — F1310 Sedative, hypnotic or anxiolytic abuse, uncomplicated: Secondary | ICD-10-CM | POA: Insufficient documentation

## 2012-05-12 LAB — RAPID URINE DRUG SCREEN, HOSP PERFORMED: Benzodiazepines: NOT DETECTED

## 2012-05-12 LAB — COMPREHENSIVE METABOLIC PANEL
Albumin: 4.1 g/dL (ref 3.5–5.2)
BUN: 12 mg/dL (ref 6–23)
Chloride: 104 mEq/L (ref 96–112)
Creatinine, Ser: 0.66 mg/dL (ref 0.50–1.10)
GFR calc non Af Amer: >90 mL/min (ref >90)
Total Bilirubin: 0.3 mg/dL (ref 0.3–1.2)

## 2012-05-12 LAB — CBC
HCT: 38.5 % (ref 36.0–46.0)
MCH: 31.7 pg (ref 26.0–34.0)
MCV: 91.9 fL (ref 78.0–100.0)
RDW: 13.7 % (ref 11.5–15.5)
WBC: 8.7 10*3/uL (ref 4.0–10.5)

## 2012-05-12 LAB — SALICYLATE LEVEL: Salicylate Lvl: <2 mg/dL — ABNORMAL LOW (ref 2.8–20.0)

## 2012-05-12 LAB — ETHANOL: Alcohol, Ethyl (B): <11 mg/dL (ref 0–11)

## 2012-05-12 LAB — POCT PREGNANCY, URINE: Preg Test, Ur: NEGATIVE

## 2012-05-12 NOTE — ED Notes (Signed)
Requesting detox from opioids prefers roxicets but states she will take anything she can. Last use this am at 7 am. Took "one percocet and one roxicet" using 1.5 years. Uses approx. 10-15 times per day.

## 2012-05-12 NOTE — ED Notes (Signed)
PT states " I just want to get clean to function better" pt denies harm to self or others.

## 2012-05-12 NOTE — ED Notes (Signed)
Pt wanded by security. 

## 2012-05-12 NOTE — ED Notes (Addendum)
No changes with pt, pt alert, NAD, calm, interactive. Report given to [facility], RN, pt updated, happy meal given, plan process wait explained with rationale, blue scrubs explained, security called to wand pt.

## 2012-05-12 NOTE — ED Provider Notes (Signed)
History     CSN: [id number]  Arrival date & time 05/12/12  1931   First MD Initiated Contact with Patient 05/12/12 2146      Chief Complaint  Patient presents with  . Medical Clearance    (Consider location/radiation/quality/duration/timing/severity/associated sxs/prior treatment) HPI Comments: Wants detox for opoids has been using for 1-2 years after tooth surgery last use this morning No previous detox attempts   The history is provided by the patient.    Past Medical History  Diagnosis Date  . Ulcer   . Hep C w/o coma, chronic     Past Surgical History  Procedure Date  . Tubal ligation   . Ercp 07/02/2011    Procedure: ENDOSCOPIC RETROGRADE CHOLANGIOPANCREATOGRAPHY (ERCP);  Surgeon: Iva Boop, MD;  Location: [facility] OR;  Service: Gastroenterology;  Laterality: N/A;  . Cholecystectomy 07/03/2011    Procedure: LAPAROSCOPIC CHOLECYSTECTOMY;  Surgeon: Jetty Duhamel, MD;  Location: [facility] OR;  Service: General;  Laterality: N/A;    Family History  Problem Relation Age of Onset  . Cancer Mother   . Diabetes Father   . Heart failure Father   . Hypertension Father   . Stroke Father     History  Substance Use Topics  . Smoking status: Current Every Day Smoker  . Smokeless tobacco: Not on file  . Alcohol Use: No    OB History    Grav Para Term Preterm Abortions TAB SAB Ect Mult Living                  Review of Systems  Constitutional: Negative for fever and chills.  Gastrointestinal: Negative for nausea and abdominal pain.  Musculoskeletal: Negative for myalgias.  Neurological: Negative for dizziness and weakness.    Allergies  Hydrocodone and Amoxicillin  Home Medications   Current Outpatient Rx  Name Route Sig Dispense Refill  . CLONAZEPAM 2 MG PO TABS Oral Take 2 mg by mouth 3 (three) times daily as needed. For anxiety    . ESOMEPRAZOLE MAGNESIUM 40 MG PO CPDR Oral Take 40 mg by mouth daily before breakfast.      . FLUOXETINE HCL 10 MG PO CAPS  Oral Take 10 mg by mouth daily.    . IBUPROFEN 200 MG PO TABS Oral Take 200 mg by mouth every 6 (six) hours as needed. For pain      BP 106/67  Pulse 96  Temp 98.2 F (36.8 C)  Resp 16  SpO2 98%  Physical Exam  Constitutional: She is oriented to person, place, and time. She appears well-developed. No distress.  HENT:  Head: Normocephalic.       Upper front teeth missing   Neck: Normal range of motion.  Cardiovascular: Normal rate.   Abdominal: Soft.  Musculoskeletal: Normal range of motion.  Neurological: She is alert and oriented to person, place, and time.  Skin: Skin is warm.    ED Course  Procedures (including critical care time)  Labs Reviewed  URINE RAPID DRUG SCREEN (HOSP PERFORMED) - Abnormal; Notable for the following:    Tetrahydrocannabinol POSITIVE (*)     All other components within normal limits  CBC  POCT PREGNANCY, URINE  ACETAMINOPHEN LEVEL  COMPREHENSIVE METABOLIC PANEL  ETHANOL  SALICYLATE LEVEL   No results found.   No diagnosis found.    MDM  Will move to Pod C for ACT evaluation         Arman Filter, NP 05/12/12 2152

## 2012-05-13 MED ORDER — ONDANSETRON HCL 8 MG PO TABS: 4.0000 mg | ORAL_TABLET | Freq: Three times a day (TID) | ORAL | Status: DC | PRN

## 2012-05-13 MED ORDER — LORAZEPAM 1 MG PO TABS
1.0000 mg | ORAL_TABLET | Freq: Three times a day (TID) | ORAL | Status: DC | PRN
  Administered 2012-05-13: 1 mg via ORAL
  Filled 2012-05-13: qty 1

## 2012-05-13 MED ORDER — IBUPROFEN 200 MG PO TABS
600.0000 mg | ORAL_TABLET | Freq: Three times a day (TID) | ORAL | Status: DC | PRN
  Administered 2012-05-13: 600 mg via ORAL
  Filled 2012-05-13: qty 3

## 2012-05-13 MED ORDER — ONDANSETRON HCL 4 MG PO TABS: 4.0000 mg | ORAL_TABLET | Freq: Four times a day (QID) | ORAL | Status: DC

## 2012-05-13 MED ORDER — NICOTINE 21 MG/24HR TD PT24: 21.0000 mg | MEDICATED_PATCH | Freq: Every day | TRANSDERMAL | Status: DC

## 2012-05-13 MED ORDER — ACETAMINOPHEN 325 MG PO TABS: 650.0000 mg | ORAL_TABLET | ORAL | Status: DC | PRN

## 2012-05-13 NOTE — BH Assessment (Signed)
Assessment Note   Cathy Santos is an 36 y.o. female who presents to [facility] seeking detox from opiates.  She reports she began abusing opiates after a dental procedure.  Cathy Santos states she continued to use them after the procedure because she liked the feeling they gave her and felt like she had energy.  She then realized she was addicted and began obtaining them off the street.  She attempted to discontinue use once in 2012 at a methadone clinic, but couldn't afford to continue going there, she has also attempted detox on her own, but has been unsuccessful due to withdrawal symptoms.  She states she wants to get off of the pills so she can be better for her kids.  "I want to be able to get out of bed and do stuff with my kids without having to take a pill."  She denies SI, HI, or AVH and is motivated for treatment.    Axis I: Substance Abuse, Substance Induced Mood Disorder and Opioid Dependence Axis II: Deferred Axis III:  Past Medical History  Diagnosis Date  . Ulcer   . Hep C w/o coma, chronic    Axis IV: problems with primary support group Axis V: 41-50 serious symptoms  Past Medical History:  Past Medical History  Diagnosis Date  . Ulcer   . Hep C w/o coma, chronic     Past Surgical History  Procedure Date  . Tubal ligation   . Ercp 07/02/2011    Procedure: ENDOSCOPIC RETROGRADE CHOLANGIOPANCREATOGRAPHY (ERCP);  Surgeon: Iva Boop, MD;  Location: [facility] OR;  Service: Gastroenterology;  Laterality: N/A;  . Cholecystectomy 07/03/2011    Procedure: LAPAROSCOPIC CHOLECYSTECTOMY;  Surgeon: Jetty Duhamel, MD;  Location: [facility] OR;  Service: General;  Laterality: N/A;    Family History:  Family History  Problem Relation Age of Onset  . Cancer Mother   . Diabetes Father   . Heart failure Father   . Hypertension Father   . Stroke Father     Social History:  reports that she has been smoking.  She does not have any smokeless tobacco history on file. She reports that she does  not drink alcohol or use illicit drugs.  Additional Social History:  Alcohol / Drug Use Pain Medications: Percocet, Roxy, Suboxone History of alcohol / drug use?: Yes Longest period of sobriety (when/how long): 2 days Withdrawal Symptoms: Cramps;Nausea / Vomiting;Diarrhea Substance #1 Name of Substance 1: Percocet 1 - Age of First Use: 35 1 - Amount (size/oz): 10-15 daily 1 - Frequency: daily 1 - Duration: 18 mos 1 - Last Use / Amount: 05/12/12 1.5 percocet Substance #2 Name of Substance 2: Roxycodone 2 - Age of First Use: 18 mos 2 - Amount (size/oz): 3-5 2 - Frequency: daily 2 - Duration: 18 mos 2 - Last Use / Amount: 05/12/12 1.5 pills Substance #3 Name of Substance 3: Suboxone 3 - Age of First Use: 35 3 - Amount (size/oz): part of a strip 3 - Frequency: every month or so 3 - Duration: 18 mos 3 - Last Use / Amount: 05/12/12 part of a strip Substance #4 Name of Substance 4: Marijuana 4 - Age of First Use: 14 4 - Amount (size/oz): quarter bag 4 - Frequency: weekly 4 - Duration: ongoing 4 - Last Use / Amount: 05/12/12  CIWA: CIWA-Ar BP: 94/61 mmHg Pulse Rate: 64  COWS: Clinical Opiate Withdrawal Scale (COWS) Resting Pulse Rate: Pulse Rate 80 or below Sweating: Subjective report of chills or  flushing Restlessness: Reports difficulty sitting still, but is able to do so Pupil Size: Pupils pinned or normal size for room light Bone or Joint Aches: Patient reports sever diffuse aching of joints/muscles Runny Nose or Tearing: Not present GI Upset: No GI symptoms Tremor: No tremor Yawning: No yawning Anxiety or Irritability: Patient reports increasing irritability or anxiousness Gooseflesh Skin: Skin is smooth COWS Total Score: 5   Allergies:  Allergies  Allergen Reactions  . Hydrocodone Itching and Nausea And Vomiting  . Amoxicillin Nausea And Vomiting    Home Medications:  (Not in a hospital admission)  OB/GYN Status:  No LMP recorded.  General Assessment  Data Location of Assessment: [facility] ED Living Arrangements: Spouse/significant other;Children (husband and 78 and 18 year old children) Can pt return to current living arrangement?: Yes Admission Status: Voluntary Is patient capable of signing voluntary admission?: Yes Transfer from: Acute Hospital Referral Source: Self/Family/Friend  Education Status Is patient currently in school?: No Highest grade of school patient has completed: 9  Risk to self Suicidal Ideation: No Suicidal Intent: No Is patient at risk for suicide?: No Suicidal Plan?: No Access to Means: No What has been your use of drugs/alcohol within the last 12 months?: ongoing Previous Attempts/Gestures: No How many times?: 0  Other Self Harm Risks: drug use Intentional Self Injurious Behavior: None Family Suicide History: Yes (sister made multiple attempts) Recent stressful life event(s): Recent negative physical changes Persecutory voices/beliefs?: No Depression: Yes Depression Symptoms: Insomnia;Tearfulness;Isolating;Guilt;Loss of interest in usual pleasures;Feeling worthless/self pity Substance abuse history and/or treatment for substance abuse?: No Suicide prevention information given to non-admitted patients: Yes  Risk to Others Homicidal Ideation: No Thoughts of Harm to Others: No Current Homicidal Intent: No Current Homicidal Plan: No Access to Homicidal Means: No History of harm to others?: No Assessment of Violence: On admission Does patient have access to weapons?: No Criminal Charges Pending?: No Does patient have a court date: No  Psychosis Hallucinations: None noted Delusions: None noted  Mental Status Report Appear/Hygiene: Disheveled Eye Contact: Fair Motor Activity: Freedom of movement Speech: Logical/coherent Level of Consciousness: Alert Mood: Guilty Affect: Sad Anxiety Level: Minimal Thought Processes: Coherent;Relevant Judgement: Unimpaired Orientation:  Person;Place;Time;Situation Obsessive Compulsive Thoughts/Behaviors: None  Cognitive Functioning Concentration: Decreased Memory: Recent Impaired;Remote Intact IQ: Average Insight: Good Impulse Control: Poor Appetite: Poor Weight Loss: 15  Weight Gain: 0  Sleep: Decreased Total Hours of Sleep: 5  Vegetative Symptoms: Staying in bed  ADLScreening [facility]) Patient's cognitive ability adequate to safely complete daily activities?: Yes Patient able to express need for assistance with ADLs?: Yes Independently performs ADLs?: Yes (appropriate for developmental age)  Abuse/Neglect [facility]) Physical Abuse: Denies Verbal Abuse: Denies Sexual Abuse: Denies  Prior Inpatient Therapy Prior Inpatient Therapy: No  Prior Outpatient Therapy Prior Outpatient Therapy: Yes Prior Therapy Dates: 2012 Prior Therapy Facilty/Provider(s): [facility] Reason for Treatment: substance abuse  ADL Screening (condition at time of admission) Patient's cognitive ability adequate to safely complete daily activities?: Yes Patient able to express need for assistance with ADLs?: Yes Independently performs ADLs?: Yes (appropriate for developmental age) Weakness of Legs: None Weakness of Arms/Hands: None       Abuse/Neglect Assessment (Assessment to be complete while patient is alone) Physical Abuse: Denies Verbal Abuse: Denies Sexual Abuse: Denies Exploitation of patient/patient's resources: Denies Self-Neglect: Denies Values / Beliefs Cultural Requests During Hospitalization: None Spiritual Requests During Hospitalization: None   Advance Directives (For Healthcare) Advance Directive: Patient does not have advance directive Pre-existing out of facility  DNR order (yellow form or pink MOST form): No Nutrition Screen- [facility] Adult/[facility]/AP Patient's home diet: Regular Have you recently lost weight without trying?: Yes If yes, how much weight have you lost?: 2-13 lb Have you been  eating poorly because of a decreased appetite?: Yes Malnutrition Screening Tool Score: 2   Additional Information 1:1 In Past 12 Months?: No CIRT Risk: No Elopement Risk: No Does patient have medical clearance?: Yes     Disposition:  Disposition Disposition of Patient: Inpatient treatment program Type of inpatient treatment program: Adult  On Site Evaluation by:  Sabino Dick Reviewed with Physician:  Julienne Kass 05/13/2012 5:29 AM

## 2012-05-13 NOTE — ED Notes (Signed)
NAD noted at time of d/c. Pt verbalized understanding of inst. Pt eating breakfast meal prior to leaving.

## 2012-05-13 NOTE — ED Notes (Signed)
Pt showering, breakfast tray ordered

## 2012-05-13 NOTE — ED Provider Notes (Signed)
Medical screening examination/treatment/procedure(s) were performed by non-physician practitioner and as supervising physician I was immediately available for consultation/collaboration.  Cyndia Degraff R. Regine Christian, MD 05/13/12 2256 

## 2012-05-13 NOTE — BH Assessment (Signed)
[facility] Assessment Progress Note      Pt does not meet criteria for inpatient detox at [facility] per [facility] as there are no opiates in her UDS.  Contacted [facility], no female beds per Arlys John.  Provided patient with referrals for CDIOP and treatment programs.  Notified EDP [place].

## 2012-10-10 ENCOUNTER — Other Ambulatory Visit: Payer: Self-pay | Admitting: Internal Medicine

## 2012-10-10 DIAGNOSIS — N6320 Unspecified lump in the left breast, unspecified quadrant: Secondary | ICD-10-CM

## 2012-10-19 ENCOUNTER — Other Ambulatory Visit: Payer: Self-pay | Admitting: Internal Medicine

## 2012-10-19 ENCOUNTER — Ambulatory Visit
Admission: RE | Admit: 2012-10-19 | Discharge: 2012-10-19 | Disposition: A | Discharge status: Home / Self Care | Payer: Medicaid Other | Source: Ambulatory Visit | Attending: Internal Medicine | Admitting: Internal Medicine

## 2012-10-19 DIAGNOSIS — N6310 Unspecified lump in the right breast, unspecified quadrant: Secondary | ICD-10-CM

## 2012-10-19 DIAGNOSIS — N6320 Unspecified lump in the left breast, unspecified quadrant: Secondary | ICD-10-CM

## 2012-10-20 ENCOUNTER — Other Ambulatory Visit: Payer: Medicaid Other

## 2012-10-25 ENCOUNTER — Inpatient Hospital Stay: Admission: RE | Admit: 2012-10-25 | Payer: Medicaid Other | Source: Ambulatory Visit

## 2012-10-26 ENCOUNTER — Ambulatory Visit
Admission: RE | Admit: 2012-10-26 | Discharge: 2012-10-26 | Disposition: A | Discharge status: Home / Self Care | Payer: Medicaid Other | Source: Ambulatory Visit | Attending: Internal Medicine | Admitting: Internal Medicine

## 2012-10-26 DIAGNOSIS — N6310 Unspecified lump in the right breast, unspecified quadrant: Secondary | ICD-10-CM

## 2013-01-06 ENCOUNTER — Encounter (HOSPITAL_COMMUNITY): Payer: Self-pay | Admitting: *Deleted

## 2013-01-06 ENCOUNTER — Emergency Department (HOSPITAL_COMMUNITY)
Admission: EM | Admit: 2013-01-06 | Discharge: 2013-01-06 | Disposition: A | Discharge status: Home / Self Care | Payer: Medicaid Other | Source: Home / Self Care

## 2013-01-06 DIAGNOSIS — R609 Edema, unspecified: Secondary | ICD-10-CM

## 2013-01-06 NOTE — ED Notes (Signed)
C/o swelling in her feet and ankles onset Thursday.

## 2013-01-06 NOTE — ED Provider Notes (Signed)
History     CSN: [id number]  Arrival date & time 01/06/13  1601   First MD Initiated Contact with Patient 01/06/13 1710      Chief Complaint  Patient presents with  . Leg Swelling    (Consider location/radiation/quality/duration/timing/severity/associated sxs/prior treatment) HPI Comments: 37 year old female appears older than her stated age is complaining of lower extremity edema and edema of the hands approximately 2 days. 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 Chest no history of known cardiac or renal disease. No known history of peripheral vascular disease. She denies shortness of breath or orthopnea. No chest pain.   Past Medical History  Diagnosis Date  . Ulcer   . Hep C w/o coma, chronic     Past Surgical History  Procedure Laterality Date  . Tubal ligation    . Ercp  07/02/2011    Procedure: ENDOSCOPIC RETROGRADE CHOLANGIOPANCREATOGRAPHY (ERCP);  Surgeon: Iva Boop, MD;  Location: [facility] OR;  Service: Gastroenterology;  Laterality: N/A;  . Cholecystectomy  07/03/2011    Procedure: LAPAROSCOPIC CHOLECYSTECTOMY;  Surgeon: Jetty Duhamel, MD;  Location: [facility] OR;  Service: General;  Laterality: N/A;    Family History  Problem Relation Age of Onset  . Cancer Mother   . Diabetes Father   . Heart failure Father   . Hypertension Father   . Stroke Father     History  Substance Use Topics  . Smoking status: Current Every Day Smoker -- 1.00 packs/day    Types: Cigarettes  . Smokeless tobacco: Not on file  . Alcohol Use: No    OB History   Grav Para Term Preterm Abortions TAB SAB Ect Mult Living                  Review of Systems  Constitutional: Negative.   Respiratory: Negative.   Cardiovascular: Negative.   Gastrointestinal: Negative.   Genitourinary: Negative.     Allergies  Hydrocodone and Amoxicillin  Home Medications   Current  Outpatient Rx  Name  Route  Sig  Dispense  Refill  . FLUoxetine (PROZAC) 10 MG capsule   Oral   Take 20 mg by mouth daily.          . hydrOXYzine (ATARAX/VISTARIL) 25 MG tablet   Oral   Take 25 mg by mouth 2 (two) times daily.         . hydrOXYzine (ATARAX/VISTARIL) 50 MG tablet   Oral   Take 50 mg by mouth at bedtime.         . lamoTRIgine (LAMICTAL) 100 MG tablet   Oral   Take 100 mg by mouth daily.         . clonazePAM (KLONOPIN) 2 MG tablet   Oral   Take 2 mg by mouth 3 (three) times daily as needed. For anxiety         . esomeprazole (NEXIUM) 40 MG capsule   Oral   Take 40 mg by mouth daily before breakfast.           . ibuprofen (ADVIL,MOTRIN) 200 MG tablet   Oral   Take 200 mg by mouth every 6 (six) hours as needed. For pain         . ondansetron (ZOFRAN) 4 MG tablet   Oral   Take 1 tablet (4 mg total) by mouth every 6 (six) hours.  12 tablet   0     BP 101/69  Pulse 71  Temp(Src) 97.4 F (36.3 C) (Oral)  Resp 16  SpO2 100%  LMP 12/30/2012  Physical Exam  Nursing note and vitals reviewed. Constitutional: She is oriented to person, place, and time. She appears well-developed and well-nourished. No distress.  Eyes: EOM are normal.  Neck: Normal range of motion. Neck supple. No JVD present.  Cardiovascular: Normal rate, regular rhythm and normal heart sounds.  Exam reveals no gallop.   No murmur heard. Pulmonary/Chest: Effort normal and breath sounds normal. No respiratory distress. She has no wheezes. She has no rales.  Abdominal: Soft. There is no tenderness.  Musculoskeletal: She exhibits edema. She exhibits no tenderness.  There is no pitting edema in the lower extremities. Chest mild ankle puffiness appreciated. No observed edema in the hands. Pedal pulses 2+. Full range of motion. No rash or skin discoloration.  Lymphadenopathy:    She has no cervical adenopathy.  Neurological: She is alert and oriented to person, place, and time. No  cranial nerve deficit. She exhibits normal muscle tone.  Skin: Skin is warm and dry.    ED Course  Procedures (including critical care time)  Labs Reviewed - No data to display No results found.   1. Peripheral edema       MDM  And was showing edema is quite minor and only seen in his ankle puffiness. No signs of cardiac etiology and doubt significant peripheral vascular disease. Suspect that this is a idiopathic some or edema anomaly. Do not recommend medication such as diuretics at this time. Doubtful at it will help. Recommend using compression stockings low to medium tension and elevate LE's.  . Followup with your primary care doctor on Tuesday as scheduled.      Hayden Rasmussen, NP 01/06/13 1757

## 2013-01-06 NOTE — ED Provider Notes (Signed)
Medical screening examination/treatment/procedure(s) were performed by non-physician practitioner and as supervising physician I was immediately available for consultation/collaboration.  Leslee Home, M.D.  Reuben Likes, MD 01/06/13 2002

## 2013-04-17 ENCOUNTER — Encounter (HOSPITAL_COMMUNITY): Payer: Self-pay | Admitting: Cardiology

## 2013-04-17 ENCOUNTER — Emergency Department (HOSPITAL_COMMUNITY)
Admission: EM | Admit: 2013-04-17 | Discharge: 2013-04-17 | Disposition: A | Discharge status: Home / Self Care | Payer: Medicaid Other | Attending: Emergency Medicine | Admitting: Emergency Medicine

## 2013-04-17 DIAGNOSIS — Z79899 Other long term (current) drug therapy: Secondary | ICD-10-CM | POA: Insufficient documentation

## 2013-04-17 DIAGNOSIS — Z8719 Personal history of other diseases of the digestive system: Secondary | ICD-10-CM | POA: Insufficient documentation

## 2013-04-17 DIAGNOSIS — B182 Chronic viral hepatitis C: Secondary | ICD-10-CM | POA: Insufficient documentation

## 2013-04-17 DIAGNOSIS — Z881 Allergy status to other antibiotic agents status: Secondary | ICD-10-CM | POA: Insufficient documentation

## 2013-04-17 DIAGNOSIS — Z885 Allergy status to narcotic agent status: Secondary | ICD-10-CM | POA: Insufficient documentation

## 2013-04-17 DIAGNOSIS — K0889 Other specified disorders of teeth and supporting structures: Secondary | ICD-10-CM

## 2013-04-17 DIAGNOSIS — K089 Disorder of teeth and supporting structures, unspecified: Secondary | ICD-10-CM | POA: Insufficient documentation

## 2013-04-17 DIAGNOSIS — F17200 Nicotine dependence, unspecified, uncomplicated: Secondary | ICD-10-CM | POA: Insufficient documentation

## 2013-04-17 MED ORDER — OXYCODONE-ACETAMINOPHEN 5-325 MG PO TABS: 1.0000 | ORAL_TABLET | ORAL | Status: DC | PRN

## 2013-04-17 MED ORDER — CLINDAMYCIN HCL 150 MG PO CAPS: 150.0000 mg | ORAL_CAPSULE | Freq: Four times a day (QID) | ORAL | Status: DC

## 2013-04-17 NOTE — ED Notes (Signed)
Pt given a small ice pack per RN

## 2013-04-17 NOTE — ED Provider Notes (Signed)
CSN: [id number]     Arrival date & time 04/17/13  1717 History  This chart was scribed for non-physician practitioner, Arthor Captain, PA-C, working with Toy Baker, MD by Clydene Laming, ED Scribe. This patient was seen in room TR08C/TR08C and the patient's care was started at 7:19 PM.   Chief Complaint  Patient presents with  . Dental Pain    The history is provided by the patient. No language interpreter was used.    HPI Comments: Cathy Santos is a 37 y.o. female who presents to the Emergency Department complaining of gradually worsening, constant, moderate lower right dental pain that has occurred for a couple of days. She also reports having associated pain the right side of her face and in her gums. Pt reports that temperature changes in food and eating worsen her pain. Pt reports previous dental surgeries. She denies any difficulty breathing or swallowing. Pt reports a subjective fever earlier in the week which has subsided. Pt reports that she is a current every day smoker of 1 pack/day who is "trying to quit".   Past Medical History  Diagnosis Date  . Ulcer   . Hep C w/o coma, chronic    Past Surgical History  Procedure Laterality Date  . Tubal ligation    . Ercp  07/02/2011    Procedure: ENDOSCOPIC RETROGRADE CHOLANGIOPANCREATOGRAPHY (ERCP);  Surgeon: Iva Boop, MD;  Location: [facility] OR;  Service: Gastroenterology;  Laterality: N/A;  . Cholecystectomy  07/03/2011    Procedure: LAPAROSCOPIC CHOLECYSTECTOMY;  Surgeon: Jetty Duhamel, MD;  Location: [facility] OR;  Service: General;  Laterality: N/A;   Family History  Problem Relation Age of Onset  . Cancer Mother   . Diabetes Father   . Heart failure Father   . Hypertension Father   . Stroke Father    History  Substance Use Topics  . Smoking status: Current Every Day Smoker -- 1.00 packs/day    Types: Cigarettes  . Smokeless tobacco: Not on file  . Alcohol Use: No   OB History   Grav Para Term Preterm Abortions  TAB SAB Ect Mult Living                 Review of Systems A complete 10 system review of systems was obtained and all systems are negative except as noted in the HPI and PMH.   Allergies  Hydrocodone and Amoxicillin  Home Medications   Current Outpatient Rx  Name  Route  Sig  Dispense  Refill  . esomeprazole (NEXIUM) 40 MG capsule   Oral   Take 40 mg by mouth daily before breakfast.           . FLUoxetine (PROZAC) 10 MG capsule   Oral   Take 20 mg by mouth daily.          Marland Kitchen gabapentin (NEURONTIN) 300 MG capsule   Oral   Take 300 mg by mouth 2 (two) times daily.         Marland Kitchen ibuprofen (ADVIL,MOTRIN) 200 MG tablet   Oral   Take 200 mg by mouth every 6 (six) hours as needed. For pain         . lamoTRIgine (LAMICTAL) 100 MG tablet   Oral   Take 100 mg by mouth daily.         . clindamycin (CLEOCIN) 150 MG capsule   Oral   Take 1 capsule (150 mg total) by mouth every 6 (six) hours.   28 capsule  0   . oxyCODONE-acetaminophen (PERCOCET) 5-325 MG per tablet   Oral   Take 1-2 tablets by mouth every 4 (four) hours as needed for pain.   20 tablet   0    Triage Vitals: BP 114/77  Pulse 59  Temp(Src) 97.9 F (36.6 C) (Oral)  Resp 18  SpO2 97%  Physical Exam  Nursing note and vitals reviewed. Constitutional: She is oriented to person, place, and time. She appears well-developed and well-nourished. No distress.  HENT:  Head: Normocephalic and atraumatic.  No heat, redness, fluctuance or induration. Multiple missing teeth, and caries present on remaining teeth. No pharyngeal edema or erythema. Uvula midline. Airway is patent.  Eyes: EOM are normal.  Neck: Neck supple. No tracheal deviation present.  Cardiovascular: Normal rate.   Pulmonary/Chest: Effort normal. No respiratory distress.  Musculoskeletal: Normal range of motion.  Neurological: She is alert and oriented to person, place, and time.  Skin: Skin is warm and dry.  Psychiatric: She has a normal  mood and affect. Her behavior is normal.    ED Course  Dental Date/Time: 04/19/2013 6:15 PM Performed by: Arthor Captain Authorized by: Arthor Captain Consent: Verbal consent obtained. Risks and benefits: risks, benefits and alternatives were discussed Consent given by: patient Patient identity confirmed: verbally with patient Time out: Immediately prior to procedure a "time out" was called to verify the correct patient, procedure, equipment, support staff and site/side marked as required. Local anesthesia used: no Patient sedated: no Patient tolerance: Patient tolerated the procedure well with no immediate complications. Comments: Patient given dental block with 0.2 mL bupivacaine 0.25% with epinephrine via controlled injection syringe with 27-gauge needle.  She had immediate relief of her pain and tolerated the procedure well.     (including critical care time)  DIAGNOSTIC STUDIES: Oxygen Saturation is 97% on RA, normal by my interpretation.    COORDINATION OF CARE: 7:27 PM- Discussed treatment plan with pt at bedside. Pt received numbing medication for tooth. Pt advised of plan for discharge with prescriptions for Percocet and Cleocin. Pt requesting a referral to a dentist. Pt verbalized understanding and agreement with plan.   Labs Review Labs Reviewed - No data to display Imaging Review No results found.  MDM   1. Pain, dental    Patient with toothache.  No gross abscess.  Exam unconcerning for Ludwig's angina or spread of infection.  Will treat with clindamycin and pain medicine.  Urged patient to follow-up with dentist.    I personally performed the services described in this documentation, which was scribed in my presence. The recorded information has been reviewed and is accurate.      Arthor Captain, PA-C 04/19/13 0981

## 2013-04-17 NOTE — ED Notes (Signed)
Pt reports tooth pain and swelling to the right side of her mouth for the past couple of days. States the pain has become intense today.

## 2013-04-20 NOTE — ED Provider Notes (Signed)
Medical screening examination/treatment/procedure(s) were performed by non-physician practitioner and as supervising physician I was immediately available for consultation/collaboration.  Lagina Reader T Kathlean Cinco, MD 04/20/13 0802 

## 2013-04-27 ENCOUNTER — Emergency Department (HOSPITAL_COMMUNITY)
Admission: EM | Admit: 2013-04-27 | Discharge: 2013-04-27 | Disposition: A | Discharge status: Home / Self Care | Payer: Medicaid Other | Attending: Emergency Medicine | Admitting: Emergency Medicine

## 2013-04-27 ENCOUNTER — Encounter (HOSPITAL_COMMUNITY): Payer: Self-pay | Admitting: Emergency Medicine

## 2013-04-27 DIAGNOSIS — Z872 Personal history of diseases of the skin and subcutaneous tissue: Secondary | ICD-10-CM | POA: Insufficient documentation

## 2013-04-27 DIAGNOSIS — F17200 Nicotine dependence, unspecified, uncomplicated: Secondary | ICD-10-CM | POA: Insufficient documentation

## 2013-04-27 DIAGNOSIS — Z79899 Other long term (current) drug therapy: Secondary | ICD-10-CM | POA: Insufficient documentation

## 2013-04-27 DIAGNOSIS — K029 Dental caries, unspecified: Secondary | ICD-10-CM | POA: Insufficient documentation

## 2013-04-27 DIAGNOSIS — K0889 Other specified disorders of teeth and supporting structures: Secondary | ICD-10-CM

## 2013-04-27 DIAGNOSIS — Z8619 Personal history of other infectious and parasitic diseases: Secondary | ICD-10-CM | POA: Insufficient documentation

## 2013-04-27 MED ORDER — OXYCODONE-ACETAMINOPHEN 5-325 MG PO TABS: 1.0000 | ORAL_TABLET | Freq: Four times a day (QID) | ORAL | Status: DC | PRN

## 2013-04-27 NOTE — ED Provider Notes (Signed)
CSN: [id number]     Arrival date & time 04/27/13  1357 History   First MD Initiated Contact with Patient 04/27/13 1511     Chief Complaint  Patient presents with  . Dental Pain   (Consider location/radiation/quality/duration/timing/severity/associated sxs/prior Treatment) Patient is a 37 y.o. female presenting with tooth pain.  Dental Pain Associated symptoms: no fever and no headaches   pt c/o right posterior molar dental pain for past 2 weeks. Constant, dull, mod-severe. Worse w eating/air. No facial, neck or jaw swelling. No trouble breathing or swallowing. No fever or chills. Has no local dentist. No trauma.     Past Medical History  Diagnosis Date  . Ulcer   . Hep C w/o coma, chronic    Past Surgical History  Procedure Laterality Date  . Tubal ligation    . Ercp  07/02/2011    Procedure: ENDOSCOPIC RETROGRADE CHOLANGIOPANCREATOGRAPHY (ERCP);  Surgeon: Iva Boop, MD;  Location: [facility] OR;  Service: Gastroenterology;  Laterality: N/A;  . Cholecystectomy  07/03/2011    Procedure: LAPAROSCOPIC CHOLECYSTECTOMY;  Surgeon: Jetty Duhamel, MD;  Location: [facility] OR;  Service: General;  Laterality: N/A;   Family History  Problem Relation Age of Onset  . Cancer Mother   . Diabetes Father   . Heart failure Father   . Hypertension Father   . Stroke Father    History  Substance Use Topics  . Smoking status: Current Every Day Smoker -- 1.00 packs/day    Types: Cigarettes  . Smokeless tobacco: Not on file  . Alcohol Use: No   OB History   Grav Para Term Preterm Abortions TAB SAB Ect Mult Living                 Review of Systems  Constitutional: Negative for fever and chills.  HENT: Negative for sore throat and trouble swallowing.   Respiratory: Negative for shortness of breath and stridor.   Neurological: Negative for headaches.    Allergies  Hydrocodone and Amoxicillin  Home Medications   Current Outpatient Rx  Name  Route  Sig  Dispense  Refill  . Aspirin-Caffeine  (BC FAST PAIN RELIEF PO)   Oral   Take 1 Package by mouth as needed (pain).         Marland Kitchen esomeprazole (NEXIUM) 40 MG capsule   Oral   Take 40 mg by mouth daily before breakfast.           . FLUoxetine (PROZAC) 10 MG capsule   Oral   Take 20 mg by mouth daily.          Marland Kitchen gabapentin (NEURONTIN) 300 MG capsule   Oral   Take 300 mg by mouth 2 (two) times daily.         Marland Kitchen ibuprofen (ADVIL,MOTRIN) 200 MG tablet   Oral   Take 200 mg by mouth every 6 (six) hours as needed. For pain         . lamoTRIgine (LAMICTAL) 100 MG tablet   Oral   Take 100 mg by mouth daily.         . clindamycin (CLEOCIN) 150 MG capsule   Oral   Take 1 capsule (150 mg total) by mouth every 6 (six) hours.   28 capsule   0   . oxyCODONE-acetaminophen (PERCOCET) 5-325 MG per tablet   Oral   Take 1-2 tablets by mouth every 4 (four) hours as needed for pain.   20 tablet   0    BP  116/80  Pulse 74  Temp(Src) 98.1 F (36.7 C) (Oral)  Resp 20  Ht 5\' 5"  (1.651 m)  Wt 190 lb (86.183 kg)  BMI 31.62 kg/m2  SpO2 99% Physical Exam  Nursing note and vitals reviewed. Constitutional: She appears well-developed and well-nourished. No distress.  HENT:  Mouth/Throat: Oropharynx is clear and moist.  Right posterior molar tenderness, decay. No gross gum swelling noted. No facial swelling or redness. No swelling or erythema to pharynx. No tenderness/swelling to floor or mouth or neck. No trismus.   Eyes: Conjunctivae are normal. No scleral icterus.  Neck: Neck supple. No tracheal deviation present.  Cardiovascular: Normal rate.   Pulmonary/Chest: Effort normal. No respiratory distress.  Abdominal: Normal appearance.  Musculoskeletal: She exhibits no edema.  Lymphadenopathy:    She has no cervical adenopathy.  Neurological: She is alert.  Skin: Skin is warm and dry. No rash noted. She is not diaphoretic.  Psychiatric: She has a normal mood and affect.    ED Course  Procedures (including critical care  time)   MDM   1. Pain, dental   2. Dental caries    Pt drove self.   Pt states has taken percocet without adverse rxn.  Stressed need of close dental follow up.      Suzi Roots, MD 04/27/13 1520

## 2013-04-27 NOTE — ED Notes (Signed)
Patient states was seen here recently for R lower molar pain.  Patient states has taken the full round of antibiotics and it still hurts.   Patient needs pain medication.  Patient stated she did not follow up with dentist.

## 2013-05-29 ENCOUNTER — Emergency Department (HOSPITAL_COMMUNITY)
Admission: EM | Admit: 2013-05-29 | Discharge: 2013-05-29 | Disposition: A | Discharge status: Home / Self Care | Payer: Medicaid Other | Attending: Emergency Medicine | Admitting: Emergency Medicine

## 2013-05-29 ENCOUNTER — Encounter (HOSPITAL_COMMUNITY): Payer: Self-pay | Admitting: Emergency Medicine

## 2013-05-29 DIAGNOSIS — Z885 Allergy status to narcotic agent status: Secondary | ICD-10-CM | POA: Insufficient documentation

## 2013-05-29 DIAGNOSIS — K0889 Other specified disorders of teeth and supporting structures: Secondary | ICD-10-CM

## 2013-05-29 DIAGNOSIS — B182 Chronic viral hepatitis C: Secondary | ICD-10-CM | POA: Insufficient documentation

## 2013-05-29 DIAGNOSIS — K029 Dental caries, unspecified: Secondary | ICD-10-CM | POA: Insufficient documentation

## 2013-05-29 DIAGNOSIS — Z79899 Other long term (current) drug therapy: Secondary | ICD-10-CM | POA: Insufficient documentation

## 2013-05-29 DIAGNOSIS — Z8719 Personal history of other diseases of the digestive system: Secondary | ICD-10-CM | POA: Insufficient documentation

## 2013-05-29 DIAGNOSIS — F17200 Nicotine dependence, unspecified, uncomplicated: Secondary | ICD-10-CM | POA: Insufficient documentation

## 2013-05-29 DIAGNOSIS — Z881 Allergy status to other antibiotic agents status: Secondary | ICD-10-CM | POA: Insufficient documentation

## 2013-05-29 DIAGNOSIS — K089 Disorder of teeth and supporting structures, unspecified: Secondary | ICD-10-CM | POA: Insufficient documentation

## 2013-05-29 MED ORDER — CLINDAMYCIN HCL 300 MG PO CAPS: 300.0000 mg | ORAL_CAPSULE | Freq: Three times a day (TID) | ORAL | Status: DC

## 2013-05-29 MED ORDER — OXYCODONE-ACETAMINOPHEN 5-325 MG PO TABS: 1.0000 | ORAL_TABLET | Freq: Four times a day (QID) | ORAL | Status: DC | PRN

## 2013-05-29 NOTE — ED Notes (Signed)
Missing upper incisors. Right canine with decay and pain x 2 days. No dentist.

## 2013-05-29 NOTE — ED Provider Notes (Signed)
CSN: [id number]     Arrival date & time 05/29/13  1211 History   First MD Initiated Contact with Patient 05/29/13 1243     Chief Complaint  Patient presents with  . Dental Pain   (Consider location/radiation/quality/duration/timing/severity/associated sxs/prior Treatment) HPI Patient presents emergency department with right upper tooth pain.  The patient, states, that the pain started 2 days, ago.  Patient, states she has multiple dental problems.  Patient denies chest pain, shortness of breath, nausea, vomiting, headache, blurred vision, weakness,dizziness, fever, or neck swelling.  The patient states, that she's not take any medications prior to arrival Past Medical History  Diagnosis Date  . Ulcer   . Hep C w/o coma, chronic    Past Surgical History  Procedure Laterality Date  . Tubal ligation    . Ercp  07/02/2011    Procedure: ENDOSCOPIC RETROGRADE CHOLANGIOPANCREATOGRAPHY (ERCP);  Surgeon: Iva Boop, MD;  Location: [facility] OR;  Service: Gastroenterology;  Laterality: N/A;  . Cholecystectomy  07/03/2011    Procedure: LAPAROSCOPIC CHOLECYSTECTOMY;  Surgeon: Jetty Duhamel, MD;  Location: [facility] OR;  Service: General;  Laterality: N/A;   Family History  Problem Relation Age of Onset  . Cancer Mother   . Diabetes Father   . Heart failure Father   . Hypertension Father   . Stroke Father    History  Substance Use Topics  . Smoking status: Current Every Day Smoker -- 1.00 packs/day    Types: Cigarettes  . Smokeless tobacco: Not on file  . Alcohol Use: No   OB History   Grav Para Term Preterm Abortions TAB SAB Ect Mult Living                 Review of Systems All other systems negative except as documented in the HPI. All pertinent positives and negatives as reviewed in the HPI. Allergies  Hydrocodone and Amoxicillin  Home Medications   Current Outpatient Rx  Name  Route  Sig  Dispense  Refill  . Aspirin-Caffeine (BC FAST PAIN RELIEF PO)   Oral   Take 1 Package by  mouth as needed (pain).         Marland Kitchen esomeprazole (NEXIUM) 40 MG capsule   Oral   Take 40 mg by mouth daily before breakfast.         . FLUoxetine (PROZAC) 10 MG capsule   Oral   Take 20 mg by mouth daily.          Marland Kitchen gabapentin (NEURONTIN) 300 MG capsule   Oral   Take 300 mg by mouth 2 (two) times daily.         Marland Kitchen ibuprofen (ADVIL,MOTRIN) 200 MG tablet   Oral   Take 200 mg by mouth every 6 (six) hours as needed. For pain          BP 127/76  Pulse 88  Temp(Src) 98.8 F (37.1 C) (Oral)  Resp 16  SpO2 98% Physical Exam  Nursing note and vitals reviewed. Constitutional: She appears well-developed and well-nourished. No distress.  HENT:  Patient has extensive dental decay and multiple missing teeth.  Patient has pain in the right upper canine is some mild redness around the tooth and the gumline  Cardiovascular: Normal rate and regular rhythm.   Pulmonary/Chest: Effort normal.    ED Course  Procedures (including critical care time) Patient is referred to the dentist on call.  Return here as needed.  Advised followup with the primary-care Dr. as well.  She is  also advised to rinse with warm water and peroxide 3 times a day    Carlyle Dolly, PA-C 05/29/13 1247

## 2013-05-29 NOTE — ED Notes (Signed)
Pt c/o Right upper front tooth pain x2 days, pt reports a history of dental cavities

## 2013-05-30 NOTE — ED Provider Notes (Signed)
Medical screening examination/treatment/procedure(s) were performed by non-physician practitioner and as supervising physician I was immediately available for consultation/collaboration.   Laray Anger, DO 05/30/13 2127

## 2013-06-24 ENCOUNTER — Encounter (HOSPITAL_COMMUNITY): Payer: Self-pay | Admitting: Emergency Medicine

## 2013-06-24 ENCOUNTER — Emergency Department (HOSPITAL_COMMUNITY)
Admission: EM | Admit: 2013-06-24 | Discharge: 2013-06-24 | Disposition: A | Discharge status: Home / Self Care | Payer: Medicaid Other | Attending: Emergency Medicine | Admitting: Emergency Medicine

## 2013-06-24 DIAGNOSIS — Z79899 Other long term (current) drug therapy: Secondary | ICD-10-CM | POA: Insufficient documentation

## 2013-06-24 DIAGNOSIS — Z8619 Personal history of other infectious and parasitic diseases: Secondary | ICD-10-CM | POA: Insufficient documentation

## 2013-06-24 DIAGNOSIS — F17200 Nicotine dependence, unspecified, uncomplicated: Secondary | ICD-10-CM | POA: Insufficient documentation

## 2013-06-24 DIAGNOSIS — K029 Dental caries, unspecified: Secondary | ICD-10-CM

## 2013-06-24 DIAGNOSIS — Z872 Personal history of diseases of the skin and subcutaneous tissue: Secondary | ICD-10-CM | POA: Insufficient documentation

## 2013-06-24 MED ORDER — TRAMADOL HCL 50 MG PO TABS: 50.0000 mg | ORAL_TABLET | Freq: Four times a day (QID) | ORAL | Status: DC | PRN

## 2013-06-24 NOTE — ED Notes (Signed)
Pt. reports persistent right and left upper dental pain with extensive dental caries / cavities for several weeks.

## 2013-06-24 NOTE — ED Provider Notes (Signed)
CSN: [id number]     Arrival date & time 06/24/13  2217 History   First MD Initiated Contact with Patient 06/24/13 2301     Chief Complaint  Patient presents with  . Dental Pain   (Consider location/radiation/quality/duration/timing/severity/associated sxs/prior Treatment) HPI Comments: A patient with extensive dental disease.  Has been seen multiple times for dental disease.  She states she missed her appointment with the dental surgeon, that she had now presents with a history that part of the tooth broke off a.  She has Magic mouthwash, that she has not started using at she has not rescheduled an appointment with the surgeon.  Patient is a 37 y.o. female presenting with tooth pain. The history is provided by the patient.  Dental Pain Location:  Generalized Quality:  Aching Severity:  Moderate Onset quality:  Gradual Timing:  Constant Progression:  Waxing and waning Chronicity:  Chronic Associated symptoms: no facial swelling, no fever and no headaches     Past Medical History  Diagnosis Date  . Ulcer   . Hep C w/o coma, chronic    Past Surgical History  Procedure Laterality Date  . Tubal ligation    . Ercp  07/02/2011    Procedure: ENDOSCOPIC RETROGRADE CHOLANGIOPANCREATOGRAPHY (ERCP);  Surgeon: Iva Boop, MD;  [facility] OR;  Service: Gastroenterology;  Laterality: N/A;  . Cholecystectomy  07/03/2011    Procedure: LAPAROSCOPIC CHOLECYSTECTOMY;  Surgeon: Jetty Duhamel, MD;  Location: [facility] OR;  Service: General;  Laterality: N/A;   Family History  Problem Relation Age of Onset  . Cancer Mother   . Diabetes Father   . Heart failure Father   . Hypertension Father   . Stroke Father    History  Substance Use Topics  . Smoking status: Current Every Day Smoker -- 1.00 packs/day    Types: Cigarettes  . Smokeless tobacco: Not on file  . Alcohol Use: No   OB History   Grav Para Term Preterm Abortions TAB SAB Ect Mult Living                 Review of Systems   Constitutional: Negative for fever and chills.  HENT: Positive for dental problem. Negative for ear pain and facial swelling.   Neurological: Negative for dizziness and headaches.  All other systems reviewed and are negative.    Allergies  Hydrocodone and Amoxicillin  Home Medications   Current Outpatient Rx  Name  Route  Sig  Dispense  Refill  . albuterol (PROVENTIL HFA;VENTOLIN HFA) 108 (90 BASE) MCG/ACT inhaler   Inhalation   Inhale 2 puffs into the lungs every 6 (six) hours as needed for wheezing or shortness of breath.         . Aspirin-Caffeine (BC FAST PAIN RELIEF PO)   Oral   Take 1 Package by mouth as needed (pain).         Marland Kitchen FLUoxetine (PROZAC) 10 MG capsule   Oral   Take 20 mg by mouth daily.          Marland Kitchen gabapentin (NEURONTIN) 300 MG capsule   Oral   Take 300 mg by mouth 2 (two) times daily.         Marland Kitchen ibuprofen (ADVIL,MOTRIN) 200 MG tablet   Oral   Take 200 mg by mouth every 6 (six) hours as needed. For pain         . omeprazole (PRILOSEC) 20 MG capsule   Oral   Take 20 mg by mouth daily.         Marland Kitchen  traMADol (ULTRAM) 50 MG tablet   Oral   Take 1 tablet (50 mg total) by mouth every 6 (six) hours as needed.   15 tablet   0    BP 106/65  Pulse 70  Temp(Src) 97.6 F (36.4 C) (Oral)  Resp 16  Wt 200 lb 14.4 oz (91.128 kg)  SpO2 98%  LMP 05/31/2013 Physical Exam  Nursing note and vitals reviewed. Constitutional: She is oriented to person, place, and time. She appears well-developed and well-nourished.  HENT:  Mouth/Throat: Oropharynx is clear and moist.  Extensive dental disease.  Dental caries no gum erythema.  No facial swelling  Eyes: Pupils are equal, round, and reactive to light.  Neck: Normal range of motion.  Cardiovascular: Normal rate.   Musculoskeletal: Normal range of motion.  Lymphadenopathy:    She has no cervical adenopathy.  Neurological: She is alert and oriented to person, place, and time.  Skin: Skin is warm.     ED Course  Procedures (including critical care time) Labs Review Labs Reviewed - No data to display Imaging Review No results found.  EKG Interpretation   None       MDM   1. Dental decay     Patient's been encouraged to continue rinsing with peroxide.  She can start using the Magic mouthwash as directed, previously.  She's been given a prescription for Ultram, and a reference to Dr. Mayford Knife, as well as the free dental clinic, but will be taking place, in [place].  This weekend    Arman Filter, NP 06/24/13 2328  Arman Filter, NP 06/24/13 2328

## 2013-06-25 NOTE — ED Provider Notes (Signed)
Medical screening examination/treatment/procedure(s) were performed by non-physician practitioner and as supervising physician I was immediately available for consultation/collaboration.    Vida Roller, MD 06/25/13 [phone]

## 2013-07-05 ENCOUNTER — Encounter (HOSPITAL_COMMUNITY): Payer: Self-pay | Admitting: Emergency Medicine

## 2013-07-05 ENCOUNTER — Emergency Department (HOSPITAL_COMMUNITY)
Admission: EM | Admit: 2013-07-05 | Discharge: 2013-07-05 | Disposition: A | Discharge status: Home / Self Care | Payer: Medicaid Other | Attending: Emergency Medicine | Admitting: Emergency Medicine

## 2013-07-05 DIAGNOSIS — K0889 Other specified disorders of teeth and supporting structures: Secondary | ICD-10-CM

## 2013-07-05 DIAGNOSIS — K089 Disorder of teeth and supporting structures, unspecified: Secondary | ICD-10-CM | POA: Insufficient documentation

## 2013-07-05 DIAGNOSIS — Z8619 Personal history of other infectious and parasitic diseases: Secondary | ICD-10-CM | POA: Insufficient documentation

## 2013-07-05 DIAGNOSIS — Z880 Allergy status to penicillin: Secondary | ICD-10-CM | POA: Insufficient documentation

## 2013-07-05 DIAGNOSIS — R6884 Jaw pain: Secondary | ICD-10-CM | POA: Insufficient documentation

## 2013-07-05 DIAGNOSIS — F17200 Nicotine dependence, unspecified, uncomplicated: Secondary | ICD-10-CM | POA: Insufficient documentation

## 2013-07-05 DIAGNOSIS — K029 Dental caries, unspecified: Secondary | ICD-10-CM | POA: Insufficient documentation

## 2013-07-05 DIAGNOSIS — Z79899 Other long term (current) drug therapy: Secondary | ICD-10-CM | POA: Insufficient documentation

## 2013-07-05 DIAGNOSIS — Z872 Personal history of diseases of the skin and subcutaneous tissue: Secondary | ICD-10-CM | POA: Insufficient documentation

## 2013-07-05 MED ORDER — OXYCODONE-ACETAMINOPHEN 5-325 MG PO TABS: 1.0000 | ORAL_TABLET | ORAL | Status: DC | PRN

## 2013-07-05 NOTE — ED Provider Notes (Signed)
CSN: [id number]     Arrival date & time 07/05/13  1436 History  This chart was scribed for non-physician practitioner Arthor Captain, PA-C working with Lyanne Co, MD by Leone Payor, ED Scribe. This patient was seen in room TR07C/TR07C and the patient's care was started at 1436.    Chief Complaint  Patient presents with  . Dental Pain    The history is provided by the patient. No language interpreter was used.     HPI Comments: Cathy Santos is a 37 y.o. female who presents to the Emergency Department complaining of sudden onset, constant right upper dental pain after her tooth broke this morning. She describes this pain as stabbing, sharp pain. She has associated right jaw pain as well. She has not tried any OTC pain medication for her symptoms. She denies dyspnea, trouble swallowing, fever, chills, HA, dizziness.    Past Medical History  Diagnosis Date  . Ulcer   . Hep C w/o coma, chronic    Past Surgical History  Procedure Laterality Date  . Tubal ligation    . Ercp  07/02/2011    Procedure: ENDOSCOPIC RETROGRADE CHOLANGIOPANCREATOGRAPHY (ERCP);  Surgeon: Iva Boop, MD;  Location: [facility] OR;  Service: Gastroenterology;  Laterality: N/A;  . Cholecystectomy  07/03/2011    Procedure: LAPAROSCOPIC CHOLECYSTECTOMY;  Surgeon: Jetty Duhamel, MD;  Location: [facility] OR;  Service: General;  Laterality: N/A;   Family History  Problem Relation Age of Onset  . Cancer Mother   . Diabetes Father   . Heart failure Father   . Hypertension Father   . Stroke Father    History  Substance Use Topics  . Smoking status: Current Every Day Smoker -- 1.00 packs/day    Types: Cigarettes  . Smokeless tobacco: Not on file  . Alcohol Use: No   OB History   Grav Para Term Preterm Abortions TAB SAB Ect Mult Living                 Review of Systems  Constitutional: Negative for fever and chills.  HENT: Positive for dental problem. Negative for trouble swallowing.   Respiratory: Negative  for shortness of breath.   Neurological: Negative for dizziness and headaches.  All other systems reviewed and are negative.    Allergies  Hydrocodone and Amoxicillin  Home Medications   Current Outpatient Rx  Name  Route  Sig  Dispense  Refill  . albuterol (PROVENTIL HFA;VENTOLIN HFA) 108 (90 BASE) MCG/ACT inhaler   Inhalation   Inhale 2 puffs into the lungs every 6 (six) hours as needed for shortness of breath.          . Aspirin-Caffeine (BC FAST PAIN RELIEF PO)   Oral   Take 1 packet by mouth daily as needed (for headache).          Marland Kitchen FLUoxetine (PROZAC) 10 MG capsule   Oral   Take 20 mg by mouth daily.          Marland Kitchen gabapentin (NEURONTIN) 300 MG capsule   Oral   Take 300 mg by mouth 2 (two) times daily.         Marland Kitchen omeprazole (PRILOSEC) 20 MG capsule   Oral   Take 20 mg by mouth daily.         . traMADol (ULTRAM) 50 MG tablet   Oral   Take 50 mg by mouth every 6 (six) hours as needed for moderate pain.  BP 102/70  Pulse 98  Temp(Src) 98 F (36.7 C) (Oral)  Resp 18  Ht 5\' 8"  (1.727 m)  Wt 182 lb 2 oz (82.611 kg)  BMI 27.70 kg/m2  SpO2 97%  LMP 05/31/2013 Physical Exam  Nursing note and vitals reviewed. Constitutional: She appears well-developed and well-nourished.  HENT:  Head: Normocephalic.  Right Ear: Tympanic membrane, external ear and ear canal normal.  Left Ear: Tympanic membrane, external ear and ear canal normal.  Nose: Nose normal. Right sinus exhibits no maxillary sinus tenderness and no frontal sinus tenderness. Left sinus exhibits no maxillary sinus tenderness and no frontal sinus tenderness.  Mouth/Throat: Uvula is midline, oropharynx is clear and moist and mucous membranes are normal. No oral lesions. Abnormal dentition. Dental caries present. No uvula swelling or lacerations. No oropharyngeal exudate, posterior oropharyngeal edema, posterior oropharyngeal erythema or tonsillar abscesses.  First molar on right upper jaw is  broken in half. There is exposed pulp and dentin. No gingival erythema or signs of infection surrounding tooth.   Eyes: Conjunctivae are normal. Pupils are equal, round, and reactive to light. Right eye exhibits no discharge. Left eye exhibits no discharge.  Neck: Normal range of motion. Neck supple.  Cardiovascular: Normal rate.   Pulmonary/Chest: Effort normal.  Abdominal: She exhibits no distension.  Lymphadenopathy:    She has no cervical adenopathy.  Neurological: She is alert.  Skin: Skin is warm and dry.  Psychiatric: She has a normal mood and affect.    ED Course  Procedures   DIAGNOSTIC STUDIES: Oxygen Saturation is 97% on Ra, adequate by my interpretation.    COORDINATION OF CARE: 3:32 PM Will perform dental block to relieve the pain. Discussed treatment plan with pt at bedside and pt agreed to plan.  Labs Review Labs Reviewed - No data to display Imaging Review No results found.  EKG Interpretation   None     Dental Performed by: Arthor Captain Authorized by: Arthor Captain Consent: Verbal consent obtained. Patient understanding: patient states understanding of the procedure being performed Patient identity confirmed: verbally with patient Local anesthesia used: yes Local anesthetic: bupivacaine 0.5% with epinephrine Anesthetic total: 0.2 ml Patient sedated: no Patient tolerance: Patient tolerated the procedure well with no immediate complications.     MDM   1. Pain, dental    Patient with toothache.  No gross abscess.  Exam unconcerning for Ludwig's angina or spread of infection.  Will treat with penicillin and pain medicine.  Urged patient to follow-up with dentist.     I personally performed the services described in this documentation, which was scribed in my presence. The recorded information has been reviewed and is accurate.    Arthor Captain, PA-C 07/07/13 1932

## 2013-07-05 NOTE — ED Notes (Signed)
Rt upper tooth broke this am and it hurts

## 2013-07-09 NOTE — ED Provider Notes (Signed)
Medical screening examination/treatment/procedure(s) were performed by non-physician practitioner and as supervising physician I was immediately available for consultation/collaboration.  EKG Interpretation   None         Omarian Jaquith M Kilyn Maragh, MD 07/09/13 0753 

## 2013-12-10 ENCOUNTER — Emergency Department (HOSPITAL_COMMUNITY)
Admission: EM | Admit: 2013-12-10 | Discharge: 2013-12-10 | Disposition: A | Discharge status: Home / Self Care | Payer: Medicaid Other | Attending: Emergency Medicine | Admitting: Emergency Medicine

## 2013-12-10 ENCOUNTER — Encounter (HOSPITAL_COMMUNITY): Payer: Self-pay | Admitting: Emergency Medicine

## 2013-12-10 DIAGNOSIS — K089 Disorder of teeth and supporting structures, unspecified: Secondary | ICD-10-CM | POA: Insufficient documentation

## 2013-12-10 DIAGNOSIS — B1920 Unspecified viral hepatitis C without hepatic coma: Secondary | ICD-10-CM | POA: Insufficient documentation

## 2013-12-10 DIAGNOSIS — K0889 Other specified disorders of teeth and supporting structures: Secondary | ICD-10-CM

## 2013-12-10 DIAGNOSIS — F17200 Nicotine dependence, unspecified, uncomplicated: Secondary | ICD-10-CM | POA: Insufficient documentation

## 2013-12-10 NOTE — ED Provider Notes (Signed)
Medical screening examination/treatment/procedure(s) were performed by non-physician practitioner and as supervising physician I was immediately available for consultation/collaboration.   EKG Interpretation None        Richardean Canalavid H Madelein Mahadeo, MD 12/10/13 1547

## 2013-12-10 NOTE — Discharge Instructions (Signed)
Call your oral surgeon to schedule an appointment.  Dental Pain A tooth ache may be caused by cavities (tooth decay). Cavities expose the nerve of the tooth to air and hot or cold temperatures. It may come from an infection or abscess (also called a boil or furuncle) around your tooth. It is also often caused by dental caries (tooth decay). This causes the pain you are having. DIAGNOSIS  Your caregiver can diagnose this problem by exam. TREATMENT   If caused by an infection, it may be treated with medications which kill germs (antibiotics) and pain medications as prescribed by your caregiver. Take medications as directed.  Only take over-the-counter or prescription medicines for pain, discomfort, or fever as directed by your caregiver.  Whether the tooth ache today is caused by infection or dental disease, you should see your dentist as soon as possible for further care. SEEK MEDICAL CARE IF: The exam and treatment you received today has been provided on an emergency basis only. This is not a substitute for complete medical or dental care. If your problem worsens or new problems (symptoms) appear, and you are unable to meet with your dentist, call or return to this location. SEEK IMMEDIATE MEDICAL CARE IF:   You have a fever.  You develop redness and swelling of your face, jaw, or neck.  You are unable to open your mouth.  You have severe pain uncontrolled by pain medicine. MAKE SURE YOU:   Understand these instructions.  Will watch your condition.  Will get help right away if you are not doing well or get worse. Document Released: 08/02/2005 Document Revised: 10/25/2011 Document Reviewed: 03/20/2008 [facility] LPExitCare Patient Information 2014 [place], [place].

## 2013-12-10 NOTE — ED Provider Notes (Signed)
CSN: [id number]     Arrival date & time 12/10/13  [id number] History  This chart was scribed for non-physician practitioner, Johnnette Gourdobyn Albert, PA-C, working with Richardean Canalavid H Yao, MD, by Ellin MayhewMichael Levi, ED Scribe. This patient was seen in room TR09C/TR09C and the patient's care was started at 10:45 AM.  The history is provided by the patient. No language interpreter was used.   HPI Comments: Cathy Santos is a 38 y.o. female who presents to the Emergency Department with a chief complaint of dental pain with onset one week ago. Patient reports she has seen a dentist one week ago and was referred to an oral surgeon to have her teeth extracted. She reports that she has several broken teeth. Patient presents to the ED for constant, progressively worsening dental pain, diffusely. Patient characterizes her pain as a severe, dull ache. She states she has not contacted the dentist since her appointment and reports that her sister will make an appointment with the oral surgeon. Patient reports that her sister believed patient would not follow up after her pain . She denies any fever, chills, or GI issues. Patient states she has taken ibuprofen with no relief.   Past Medical History  Diagnosis Date  . Ulcer   . Hep C w/o coma, chronic    Past Surgical History  Procedure Laterality Date  . Tubal ligation    . Ercp  07/02/2011    Procedure: ENDOSCOPIC RETROGRADE CHOLANGIOPANCREATOGRAPHY (ERCP);  Surgeon: Iva Booparl E Gessner, MD;  [facility];  Service: Gastroenterology;  Laterality: N/A;  . Cholecystectomy  07/03/2011    Procedure: LAPAROSCOPIC CHOLECYSTECTOMY;  Surgeon: Jetty DuhamelJames O Wyatt III, MD;  Location: [facility] OR;  Service: General;  Laterality: N/A;   Family History  Problem Relation Age of Onset  . Cancer Mother   . Diabetes Father   . Heart failure Father   . Hypertension Father   . Stroke Father    History  Substance Use Topics  . Smoking status: Current Every Day Smoker -- 1.00 packs/day    Types: Cigarettes   . Smokeless tobacco: Not on file  . Alcohol Use: No   OB History   Grav Para Term Preterm Abortions TAB SAB Ect Mult Living                 Review of Systems  Constitutional: Negative for fever and chills.  HENT: Positive for dental problem.   Respiratory: Negative for shortness of breath.   Gastrointestinal: Negative for nausea and vomiting.  Neurological: Negative for weakness.  A complete 10 system review of systems was obtained and all systems are negative except as noted in the HPI and PMH.   Allergies  Hydrocodone and Amoxicillin  Home Medications   Prior to Admission medications   Medication Sig Start Date End Date Taking? Authorizing Provider  albuterol (PROVENTIL HFA;VENTOLIN HFA) 108 (90 BASE) MCG/ACT inhaler Inhale 2 puffs into the lungs every 6 (six) hours as needed for shortness of breath.    Yes Historical Provider, MD  Aspirin-Caffeine (BC FAST PAIN RELIEF PO) Take 1 packet by mouth daily as needed (for headache).    Yes Historical Provider, MD  FLUoxetine (PROZAC) 10 MG capsule Take 20 mg by mouth daily.    Yes Historical Provider, MD  gabapentin (NEURONTIN) 300 MG capsule Take 300 mg by mouth 2 (two) times daily.   Yes Historical Provider, MD  omeprazole (PRILOSEC) 20 MG capsule Take 20 mg by mouth daily.   Yes Historical Provider, MD  traMADol (ULTRAM) 50 MG tablet Take 50 mg by mouth every 6 (six) hours as needed for moderate pain. 06/24/13  Yes Arman FilterGail K Schulz, NP   Triage Vitals: BP 140/80  Pulse 71  Temp(Src) 98 F (36.7 C) (Oral)  Resp 16  Ht 5\' 4"  (1.626 m)  Wt 200 lb (90.719 kg)  BMI 34.31 kg/m2  SpO2 98%  LMP 11/11/2013  Physical Exam  Nursing note and vitals reviewed. Constitutional: She is oriented to person, place, and time. She appears well-developed and well-nourished. No distress.  HENT:  Head: Normocephalic and atraumatic.  Mouth/Throat: Oropharynx is clear and moist. Abnormal dentition. Dental caries present. No dental abscesses.  Poor  dentition, tooth decay and multiple dental caries. No inflammation or dental abscess. No adenopathy.   Eyes: Conjunctivae and EOM are normal.  Neck: Normal range of motion. Neck supple.  Cardiovascular: Normal rate, regular rhythm and normal heart sounds.   Pulmonary/Chest: Effort normal and breath sounds normal. No respiratory distress.  Musculoskeletal: Normal range of motion. She exhibits no edema.  Neurological: She is alert and oriented to person, place, and time. No sensory deficit.  Skin: Skin is warm and dry.  Psychiatric: She has a normal mood and affect. Her behavior is normal.    ED Course  Dental Date/Time: 12/10/2013 10:51 AM Performed by: Trevor MaceALBERT, Wylene Weissman M Authorized by: Trevor MaceALBERT, Sherah Lund M Consent: Verbal consent obtained. Patient identity confirmed: verbally with patient Preparation: Patient was prepped and draped in the usual sterile fashion. Local anesthesia used: yes Anesthesia: nerve block Local anesthetic: bupivacaine 0.5% with epinephrine Anesthetic total: 1.8 ml Patient sedated: no   (including critical care time)  COORDINATION OF CARE: 10:49 AM-Recommended patient to f/u with oral surgeon. Patient has requested having a dental block performed in the ED. Treatment plan discussed with patient and patient agrees.  Labs Review Labs Reviewed - No data to display  Imaging Review No results found.   EKG Interpretation None      MDM   Final diagnoses:  Pain, dental   Patient presenting with dental pain without associated dental infection. She appears in no apparent distress. She is supposed to see an Transport planneroral surgeon. Dental block given for pain relief. Advised her to call her oral surgeon. Stable for discharge. Return precautions given. Patient states understanding of treatment care plan and is agreeable.   I personally performed the services described in this documentation, which was scribed in my presence. The recorded information has been reviewed and is  accurate.    Trevor MaceRobyn M Albert, PA-C 12/10/13 1108

## 2013-12-10 NOTE — ED Notes (Signed)
Pt presents with dental pain, pt has seen a dentist and was referred to an oral surgeon to have her teeth extracted. Pt is here for increase pain.

## 2013-12-15 ENCOUNTER — Encounter (HOSPITAL_COMMUNITY): Payer: Self-pay | Admitting: Emergency Medicine

## 2013-12-15 ENCOUNTER — Emergency Department (HOSPITAL_COMMUNITY)
Admission: EM | Admit: 2013-12-15 | Discharge: 2013-12-15 | Disposition: A | Discharge status: Home / Self Care | Payer: Medicaid Other | Attending: Emergency Medicine | Admitting: Emergency Medicine

## 2013-12-15 DIAGNOSIS — Z79899 Other long term (current) drug therapy: Secondary | ICD-10-CM | POA: Insufficient documentation

## 2013-12-15 DIAGNOSIS — Z872 Personal history of diseases of the skin and subcutaneous tissue: Secondary | ICD-10-CM | POA: Insufficient documentation

## 2013-12-15 DIAGNOSIS — F17200 Nicotine dependence, unspecified, uncomplicated: Secondary | ICD-10-CM | POA: Insufficient documentation

## 2013-12-15 DIAGNOSIS — Z8619 Personal history of other infectious and parasitic diseases: Secondary | ICD-10-CM | POA: Insufficient documentation

## 2013-12-15 DIAGNOSIS — G8929 Other chronic pain: Secondary | ICD-10-CM

## 2013-12-15 DIAGNOSIS — Z7982 Long term (current) use of aspirin: Secondary | ICD-10-CM | POA: Insufficient documentation

## 2013-12-15 DIAGNOSIS — K089 Disorder of teeth and supporting structures, unspecified: Secondary | ICD-10-CM | POA: Insufficient documentation

## 2013-12-15 MED ORDER — OXYCODONE-ACETAMINOPHEN 5-325 MG PO TABS
1.0000 | ORAL_TABLET | Freq: Once | ORAL | Status: AC
  Administered 2013-12-15: 1 via ORAL
  Filled 2013-12-15: qty 1

## 2013-12-15 MED ORDER — OXYCODONE-ACETAMINOPHEN 5-325 MG PO TABS: ORAL_TABLET | ORAL | Status: DC

## 2013-12-15 MED ORDER — METRONIDAZOLE 500 MG PO TABS: 500.0000 mg | ORAL_TABLET | Freq: Two times a day (BID) | ORAL | Status: DC

## 2013-12-15 MED ORDER — METRONIDAZOLE 500 MG PO TABS
500.0000 mg | ORAL_TABLET | Freq: Once | ORAL | Status: AC
  Administered 2013-12-15: 500 mg via ORAL
  Filled 2013-12-15: qty 1

## 2013-12-15 NOTE — ED Provider Notes (Signed)
CSN: [id number]     Arrival date & time 12/15/13  2112 History   First MD Initiated Contact with Patient 12/15/13 2118     Chief Complaint  Patient presents with  . Dental Pain     (Consider location/radiation/quality/duration/timing/severity/associated sxs/prior Treatment) HPI  Cathy Santos is a 38 y.o. female complaining of diffuse severe dental pain worsening over 3 days. Denies fever/chills, difficulty opening jaw, difficulty swallowing, SOB, gum swelling, facial swelling, neck swelling.    Past Medical History  Diagnosis Date  . Ulcer   . Hep C w/o coma, chronic    Past Surgical History  Procedure Laterality Date  . Tubal ligation    . Ercp  07/02/2011    Procedure: ENDOSCOPIC RETROGRADE CHOLANGIOPANCREATOGRAPHY (ERCP);  Surgeon: Iva Booparl E Gessner, MD;  [facility];  Service: Gastroenterology;  Laterality: N/A;  . Cholecystectomy  07/03/2011    Procedure: LAPAROSCOPIC CHOLECYSTECTOMY;  Surgeon: Jetty DuhamelJames O Wyatt III, MD;  Location: [facility] OR;  Service: General;  Laterality: N/A;   Family History  Problem Relation Age of Onset  . Cancer Mother   . Diabetes Father   . Heart failure Father   . Hypertension Father   . Stroke Father    History  Substance Use Topics  . Smoking status: Current Every Day Smoker -- 1.00 packs/day    Types: Cigarettes  . Smokeless tobacco: Not on file  . Alcohol Use: No   OB History   Grav Para Term Preterm Abortions TAB SAB Ect Mult Living                 Review of Systems  10 systems reviewed and found to be negative, except as noted in the HPI.  Allergies  Hydrocodone and Amoxicillin  Home Medications   Prior to Admission medications   Medication Sig Start Date End Date Taking? Authorizing Provider  albuterol (PROVENTIL HFA;VENTOLIN HFA) 108 (90 BASE) MCG/ACT inhaler Inhale 2 puffs into the lungs every 6 (six) hours as needed for shortness of breath.     Historical Provider, MD  Aspirin-Caffeine (BC FAST PAIN RELIEF PO) Take 1  packet by mouth daily as needed (for headache).     Historical Provider, MD  FLUoxetine (PROZAC) 10 MG capsule Take 20 mg by mouth daily.     Historical Provider, MD  gabapentin (NEURONTIN) 300 MG capsule Take 300 mg by mouth 2 (two) times daily.    Historical Provider, MD  omeprazole (PRILOSEC) 20 MG capsule Take 20 mg by mouth daily.    Historical Provider, MD  traMADol (ULTRAM) 50 MG tablet Take 50 mg by mouth every 6 (six) hours as needed for moderate pain. 06/24/13   Arman FilterGail K Schulz, NP   BP 122/73  Pulse 74  Temp(Src) 98.1 F (36.7 C) (Oral)  Resp 18  Ht 5\' 4"  (1.626 m)  Wt 208 lb 8 oz (94.575 kg)  BMI 35.77 kg/m2  SpO2 98%  LMP 11/11/2013 Physical Exam  Nursing note and vitals reviewed. Constitutional: She is oriented to person, place, and time. She appears well-developed and well-nourished. No distress.  HENT:  Head: Normocephalic.  Generally poor dentition, no gingival swelling, erythema or tenderness to palpation. Patient is handling their secretions. There is no tenderness to palpation or firmness underneath tongue bilaterally. No trismus.    Eyes: Conjunctivae and EOM are normal.  Cardiovascular: Normal rate.   Pulmonary/Chest: Effort normal. No stridor.  Musculoskeletal: Normal range of motion.  Neurological: She is alert and oriented to person, place, and  time.  Psychiatric: She has a normal mood and affect.    ED Course  Procedures (including critical care time) Labs Review Labs Reviewed - No data to display  Imaging Review No results found.   EKG Interpretation None      MDM   Final diagnoses:  Chronic dental pain    Filed Vitals:   12/15/13 2119  BP: 122/73  Pulse: 74  Temp: 98.1 F (36.7 C)  TempSrc: Oral  Resp: 18  Height: 5\' 4"  (1.626 m)  Weight: 208 lb 8 oz (94.575 kg)  SpO2: 98%    Medications  oxyCODONE-acetaminophen (PERCOCET/ROXICET) 5-325 MG per tablet 1 tablet (1 tablet Oral Given 12/15/13 2141)  metroNIDAZOLE (FLAGYL) tablet 500  mg (500 mg Oral Given 12/15/13 2141)    Cathy Santos is a 38 y.o. female presenting with dental pain associated with dental cary but no signs or symptoms of dental abscess. Patient afebrile, non toxic appearing and swallowing secretions well. I gave patient referral to dentist and stressed the importance of dental follow up for ultimate management of dental pain. Patient voices understanding and is agreeable to plan.   Evaluation does not show pathology that would require ongoing emergent intervention or inpatient treatment. Pt is hemodynamically stable and mentating appropriately. Discussed findings and plan with patient/guardian, who agrees with care plan. All questions answered. Return precautions discussed and outpatient follow up given.   Discharge Medication List as of 12/15/2013  9:36 PM    START taking these medications   Details  metroNIDAZOLE (FLAGYL) 500 MG tablet Take 1 tablet (500 mg total) by mouth 2 (two) times daily. One po bid x 7 days, Starting 12/15/2013, Until Discontinued, Print    oxyCODONE-acetaminophen (PERCOCET/ROXICET) 5-325 MG per tablet 1 to 2 tabs PO q6hrs  PRN for pain, Print        Note: Portions of this report may have been transcribed using voice recognition software. Every effort was made to ensure accuracy; however, inadvertent computerized transcription errors may be present     Wynetta Emeryicole Zyairah Wacha, PA-C 12/16/13 0143

## 2013-12-15 NOTE — Discharge Instructions (Signed)
Take percocet for breakthrough pain, do not drink alcohol, drive, care for children or do other critical tasks while taking percocet. ° °Return to the emergency room for fever, change in vision, redness to the face that rapidly spreads towards the eye, nausea or vomiting, difficulty swallowing or shortness of breath. °  °Apply warm compresses to jaw throughout the day.  ° ° Take your antibiotics as directed and to the end of the course.  ° °Followup with a dentist is very important for ongoing evaluation and management of recurrent dental pain. Return to emergency department for emergent changing or worsening symptoms." ° °Low-cost dental clinic: °**David  Civils  at [phone]**  °**Janna Civils at [phone] [address]**   ° °You may also call [phone] ° °Dental Assistance °If the dentist on-call cannot see you, please use the resources below: ° ° °Patients with Medicaid: [facility] °[address], [phone] °[address], [phone] ° °If unable to pay, or uninsured, contact [facility] ([phone]) or [facility] ([phone] in [place], [phone] in [place]) to become qualified for the adult dental clinic ° °Other Low-Cost Community Dental Services: °[facility]- [address], [place], [place], [address] °   [phone], Ext. [phone] °   2nd and 4th Thursday of the month at 6:30am °   10 clients each day by appointment, can sometimes see walk-in     patients if someone does not show for an appointment °[facility]- [address], [place], [place], [address] °   [phone] °[facility]- [address], [place], [place], [address] °   [phone] ° °[facility]- [phone] °[facility]- [phone] °[facility]- [phone] ° °

## 2013-12-15 NOTE — ED Notes (Signed)
Pt states that her friend is driving her home, education given regarding driving under the influence.

## 2013-12-15 NOTE — ED Notes (Signed)
The pt is c/o a toothache with broken teeth for several days

## 2013-12-16 NOTE — ED Provider Notes (Signed)
Medical screening examination/treatment/procedure(s) were performed by non-physician practitioner and as supervising physician I was immediately available for consultation/collaboration.   EKG Interpretation None        Gwyneth SproutWhitney Wetzel Meester, MD 12/16/13 2326

## 2014-01-24 ENCOUNTER — Encounter (HOSPITAL_COMMUNITY): Payer: Self-pay | Admitting: Emergency Medicine

## 2014-01-24 ENCOUNTER — Emergency Department (HOSPITAL_COMMUNITY)
Admission: EM | Admit: 2014-01-24 | Discharge: 2014-01-24 | Disposition: A | Discharge status: Home / Self Care | Payer: Medicaid Other | Attending: Emergency Medicine | Admitting: Emergency Medicine

## 2014-01-24 DIAGNOSIS — Z79899 Other long term (current) drug therapy: Secondary | ICD-10-CM | POA: Insufficient documentation

## 2014-01-24 DIAGNOSIS — Z8619 Personal history of other infectious and parasitic diseases: Secondary | ICD-10-CM | POA: Insufficient documentation

## 2014-01-24 DIAGNOSIS — Z872 Personal history of diseases of the skin and subcutaneous tissue: Secondary | ICD-10-CM | POA: Insufficient documentation

## 2014-01-24 DIAGNOSIS — F17200 Nicotine dependence, unspecified, uncomplicated: Secondary | ICD-10-CM | POA: Insufficient documentation

## 2014-01-24 DIAGNOSIS — K08109 Complete loss of teeth, unspecified cause, unspecified class: Secondary | ICD-10-CM | POA: Insufficient documentation

## 2014-01-24 DIAGNOSIS — K089 Disorder of teeth and supporting structures, unspecified: Secondary | ICD-10-CM | POA: Insufficient documentation

## 2014-01-24 DIAGNOSIS — K029 Dental caries, unspecified: Secondary | ICD-10-CM

## 2014-01-24 DIAGNOSIS — Z880 Allergy status to penicillin: Secondary | ICD-10-CM | POA: Insufficient documentation

## 2014-01-24 DIAGNOSIS — K0381 Cracked tooth: Secondary | ICD-10-CM | POA: Insufficient documentation

## 2014-01-24 MED ORDER — TRAMADOL HCL 50 MG PO TABS: 50.0000 mg | ORAL_TABLET | Freq: Four times a day (QID) | ORAL | Status: DC | PRN

## 2014-01-24 MED ORDER — ACETAMINOPHEN-CODEINE #3 300-30 MG PO TABS: 1.0000 | ORAL_TABLET | Freq: Four times a day (QID) | ORAL | Status: DC | PRN

## 2014-01-24 NOTE — Discharge Instructions (Signed)
Please followup with a dentist or oral surgeon tomorrow for continued evaluation and treatment of your dental problems.    Dental Pain A tooth ache may be caused by cavities (tooth decay). Cavities expose the nerve of the tooth to air and hot or cold temperatures. It may come from an infection or abscess (also called a boil or furuncle) around your tooth. It is also often caused by dental caries (tooth decay). This causes the pain you are having. DIAGNOSIS  Your caregiver can diagnose this problem by exam. TREATMENT   If caused by an infection, it may be treated with medications which kill germs (antibiotics) and pain medications as prescribed by your caregiver. Take medications as directed.  Only take over-the-counter or prescription medicines for pain, discomfort, or fever as directed by your caregiver.  Whether the tooth ache today is caused by infection or dental disease, you should see your dentist as soon as possible for further care. SEEK MEDICAL CARE IF: The exam and treatment you received today has been provided on an emergency basis only. This is not a substitute for complete medical or dental care. If your problem worsens or new problems (symptoms) appear, and you are unable to meet with your dentist, call or return to this location. SEEK IMMEDIATE MEDICAL CARE IF:   You have a fever.  You develop redness and swelling of your face, jaw, or neck.  You are unable to open your mouth.  You have severe pain uncontrolled by pain medicine. MAKE SURE YOU:   Understand these instructions.  Will watch your condition.  Will get help right away if you are not doing well or get worse. Document Released: 08/02/2005 Document Revised: 10/25/2011 Document Reviewed: 03/20/2008 [facility] Patient Information 2014 [employer] [place].   Dental Caries  Dental caries (also called tooth decay) is the most common oral disease. It can occur at any age, but is more common in children and young  adults.  HOW DENTAL CARIES DEVELOPS  The process of decay begins when bacteria and foods (particularly sugars and starches) combine in your mouth to produce plaque. Plaque is a substance that sticks to the hard, outer surface of a tooth (enamel). The bacteria in plaque produce acids that attack enamel. These acids may also attack the root surface of a tooth (cementum) if it is exposed. Repeated attacks dissolve these surfaces and create holes in the tooth (cavities). If left untreated, the acids destroy the other layers of the tooth.  RISK FACTORS  Frequent sipping of sugary beverages.   Frequent snacking on sugary and starchy foods, especially those that easily get stuck in the teeth.   Poor oral hygiene.   Dry mouth.   Substance abuse such as methamphetamine abuse.   Broken or poor-fitting dental restorations.   Eating disorders.   Gastroesophageal reflux disease (GERD).   Certain radiation treatments to the head and neck. SYMPTOMS In the early stages of dental caries, symptoms are seldom present. Sometimes white, chalky areas may be seen on the enamel or other tooth layers. In later stages, symptoms may include:  Pits and holes on the enamel.  Toothache after sweet, hot, or cold foods or drinks are consumed.  Pain around the tooth.  Swelling around the tooth. DIAGNOSIS  Most of the time, dental caries is detected during a regular dental checkup. A diagnosis is made after a thorough medical and dental history is taken and the surfaces of your teeth are checked for signs of dental caries. Sometimes special instruments, such  as lasers, are used to check for dental caries. Dental X-ray exams may be taken so that areas not visible to the eye (such as between the contact areas of the teeth) can be checked for cavities.  TREATMENT  If dental caries is in its early stages, it may be reversed with a fluoride treatment or an application of a remineralizing agent at the dental  office. Thorough brushing and flossing at home is needed to aid these treatments. If it is in its later stages, treatment depends on the location and extent of tooth destruction:   If a small area of the tooth has been destroyed, the destroyed area will be removed and cavities will be filled with a material such as gold, silver amalgam, or composite resin.   If a large area of the tooth has been destroyed, the destroyed area will be removed and a cap (crown) will be fitted over the remaining tooth structure.   If the center part of the tooth (pulp) is affected, a procedure called a root canal will be needed before a filling or crown can be placed.   If most of the tooth has been destroyed, the tooth may need to be pulled (extracted). HOME CARE INSTRUCTIONS You can prevent, stop, or reverse dental caries at home by practicing good oral hygiene. Good oral hygiene includes:  Thoroughly cleaning your teeth at least twice a day with a toothbrush and dental floss.   Using a fluoride toothpaste. A fluoride mouth rinse may also be used if recommended by your dentist or health care provider.   Restricting the amount of sugary and starchy foods and sugary liquids you consume.   Avoiding frequent snacking on these foods and sipping of these liquids.   Keeping regular visits with a dentist for checkups and cleanings. PREVENTION   Practice good oral hygiene.  Consider a dental sealant. A dental sealant is a coating material that is applied by your dentist to the pits and grooves of teeth. The sealant prevents food from being trapped in them. It may protect the teeth for several years.  Ask about fluoride supplements if you live in a community without fluorinated water or with water that has a low fluoride content. Use fluoride supplements as directed by your dentist or health care provider.  Allow fluoride varnish applications to teeth if directed by your dentist or health care  provider. Document Released: 04/24/2002 Document Revised: 04/04/2013 Document Reviewed: 08/04/2012 [facility] Patient Information 2014 [place], [place].

## 2014-01-24 NOTE — ED Notes (Signed)
Patient given 1 bus pass.

## 2014-01-24 NOTE — ED Notes (Signed)
The patient has been having pain from a tooth that has been broken off.  The dental pain is on the left upper side.  The patient said she has gone to the dentist and they referred her to the oral surgeon.  Her oral surgery is scheduled for September but she is in so much pain she cannot wait to have something done.  She rates her pain 10/10.

## 2014-01-24 NOTE — ED Provider Notes (Signed)
CSN: [id number]     Arrival date & time 01/24/14  1721 History  This chart was scribed for Ivonne AndrewPeter Nichael Ehly, PA working with Vanetta MuldersScott Zackowski, MD by Evon Slackerrance Branch, ED Scribe. This patient was seen in room TR07C/TR07C and the patient's care was started at 6:50 PM.    Chief Complaint  Patient presents with  . Dental Pain    The patient has been having pain from a tooth that has been broken off.  The dental pain is on the left upper side.   Patient is a 38 y.o. female presenting with tooth pain. The history is provided by the patient. No language interpreter was used.  Dental Pain Associated symptoms: no fever    HPI Comments: Cathy Santos is a 38 y.o. female who presents to the Emergency Department complaining of left upper dental pain onset 2 months prior. She states that she has tooth broken off that is still remaining in her gums. She states that several of her teeth have been breaking off in the past 2 months. She states that her pain severity is 10/10. She states she has an oral surgeon appointment September 2nd but is in too much pain to wait until then. She states she has been using oral gel with no improvement to her symptoms. She denies fever, chills or other related symptoms. She states she has a h/o of having teeth removed. She states she is a current smoker.     Past Medical History  Diagnosis Date  . Ulcer   . Hep C w/o coma, chronic    Past Surgical History  Procedure Laterality Date  . Tubal ligation    . Ercp  07/02/2011    Procedure: ENDOSCOPIC RETROGRADE CHOLANGIOPANCREATOGRAPHY (ERCP);  Surgeon: Iva Booparl E Gessner, MD;  [facility];  Service: Gastroenterology;  Laterality: N/A;  . Cholecystectomy  07/03/2011    Procedure: LAPAROSCOPIC CHOLECYSTECTOMY;  Surgeon: Jetty DuhamelJames O Wyatt III, MD;  Location: [facility] OR;  Service: General;  Laterality: N/A;   Family History  Problem Relation Age of Onset  . Cancer Mother   . Diabetes Father   . Heart failure Father   . Hypertension  Father   . Stroke Father    History  Substance Use Topics  . Smoking status: Current Every Day Smoker -- 1.00 packs/day    Types: Cigarettes  . Smokeless tobacco: Not on file  . Alcohol Use: No   OB History   Grav Para Term Preterm Abortions TAB SAB Ect Mult Living                 Review of Systems  Constitutional: Negative for fever and chills.  HENT: Positive for dental problem.   All other systems reviewed and are negative.   Allergies  Hydrocodone and Amoxicillin  Home Medications   Prior to Admission medications   Medication Sig Start Date End Date Taking? Authorizing Provider  albuterol (PROVENTIL HFA;VENTOLIN HFA) 108 (90 BASE) MCG/ACT inhaler Inhale 2 puffs into the lungs every 6 (six) hours as needed for shortness of breath.    Yes Historical Provider, MD  FLUoxetine (PROZAC) 10 MG capsule Take 20 mg by mouth daily.    Yes Historical Provider, MD  gabapentin (NEURONTIN) 300 MG capsule Take 300 mg by mouth 2 (two) times daily.   Yes Historical Provider, MD  omeprazole (PRILOSEC) 20 MG capsule Take 20 mg by mouth daily.   Yes Historical Provider, MD   Triage Vitals: BP 117/61  Pulse 72  Temp(Src) 98  F (36.7 C) (Oral)  Resp 19  Ht 5\' 4"  (1.626 m)  Wt 200 lb (90.719 kg)  BMI 34.31 kg/m2  SpO2 99%  LMP 01/12/2014  Physical Exam  Nursing note and vitals reviewed. Constitutional: She is oriented to person, place, and time. She appears well-developed and well-nourished. No distress.  HENT:  Head: Normocephalic and atraumatic.  Mouth/Throat: Dental caries present.  Poor dentition throughout. Multiple teeth have been extracted in the past. There are significant dental caries on all the remaining teeth. Some of the upper teeth have been broken and decayed the gum line. There is no swelling of the stones or signs of infection. Pain and swelling under the tongue.  Eyes: Conjunctivae are normal.  Neck: Normal range of motion. No tracheal deviation present.   Cardiovascular: Normal rate, regular rhythm and normal heart sounds.   Pulmonary/Chest: Effort normal and breath sounds normal. No respiratory distress.  Musculoskeletal: Normal range of motion.  Lymphadenopathy:    She has no cervical adenopathy.  Neurological: She is alert and oriented to person, place, and time.  Skin: Skin is warm and dry.  Psychiatric: She has a normal mood and affect. Her behavior is normal.    ED Course  Procedures    DIAGNOSTIC STUDIES: Oxygen Saturation is 99% on RA, normal by my interpretation.    COORDINATION OF CARE: 6:55 PM-Discussed treatment plan which includes pain medication with pt at bedside and pt agreed to plan.   Patient with ongoing dental problems worsened over the last 2 months. States that she is supposed to followup with an oral Careers adviser in September. No other new changes. No sinus concerning for infection. We'll give dental and oral surgery referral.   MDM   Final diagnoses:  Pain due to dental caries    I personally performed the services described in this documentation, which was scribed in my presence. The recorded information has been reviewed and is accurate.     Angus Seller, PA-C 01/24/14 1903

## 2014-01-26 NOTE — ED Provider Notes (Signed)
Medical screening examination/treatment/procedure(s) were performed by non-physician practitioner and as supervising physician I was immediately available for consultation/collaboration.   EKG Interpretation None        Norvell Ureste, MD 01/26/14 1614 

## 2014-01-27 ENCOUNTER — Emergency Department (HOSPITAL_COMMUNITY)
Admission: EM | Admit: 2014-01-27 | Discharge: 2014-01-27 | Disposition: A | Discharge status: Home / Self Care | Payer: Medicaid Other | Attending: Emergency Medicine | Admitting: Emergency Medicine

## 2014-01-27 ENCOUNTER — Encounter (HOSPITAL_COMMUNITY): Payer: Self-pay | Admitting: Emergency Medicine

## 2014-01-27 DIAGNOSIS — K0889 Other specified disorders of teeth and supporting structures: Secondary | ICD-10-CM

## 2014-01-27 DIAGNOSIS — K089 Disorder of teeth and supporting structures, unspecified: Secondary | ICD-10-CM | POA: Insufficient documentation

## 2014-01-27 DIAGNOSIS — K029 Dental caries, unspecified: Secondary | ICD-10-CM | POA: Insufficient documentation

## 2014-01-27 DIAGNOSIS — Z880 Allergy status to penicillin: Secondary | ICD-10-CM | POA: Insufficient documentation

## 2014-01-27 DIAGNOSIS — Z872 Personal history of diseases of the skin and subcutaneous tissue: Secondary | ICD-10-CM | POA: Insufficient documentation

## 2014-01-27 DIAGNOSIS — Z8619 Personal history of other infectious and parasitic diseases: Secondary | ICD-10-CM | POA: Insufficient documentation

## 2014-01-27 DIAGNOSIS — Z79899 Other long term (current) drug therapy: Secondary | ICD-10-CM | POA: Insufficient documentation

## 2014-01-27 DIAGNOSIS — F17200 Nicotine dependence, unspecified, uncomplicated: Secondary | ICD-10-CM | POA: Insufficient documentation

## 2014-01-27 MED ORDER — TRAMADOL HCL 50 MG PO TABS: 50.0000 mg | ORAL_TABLET | Freq: Four times a day (QID) | ORAL | Status: DC | PRN

## 2014-01-27 NOTE — ED Notes (Signed)
Pt escorted to discharge window. Pt verbalized understanding discharge instructions. In no acute distress.  

## 2014-01-27 NOTE — ED Provider Notes (Signed)
CSN: [id number]     Arrival date & time 01/27/14  1705 History  This chart was scribed for non-physician practitioner, Teressa LowerVrinda Andres Bantz, NP working with Shon Batonourtney F Horton, MD by Greggory StallionKayla Andersen, ED scribe. This patient was seen in room WTR5/WTR5 and the patient's care was started at 5:58 PM.   Chief Complaint  Patient presents with  . Dental Pain   The history is provided by the patient. No language interpreter was used.   HPI Comments: Cathy Santos is a 38 y.o. female who presents to the Emergency Department complaining of gradual onset upper and lower dental pain that has been ongoing for several months. Pt has been seen several times in the past for the same. States she has broken teeth on the top and bottom. She has been seen by a dentist and referred to an oral surgeon but does not have an appointment until 04/17/14. Pt has used Orajel with little relief.    Past Medical History  Diagnosis Date  . Ulcer   . Hep C w/o coma, chronic    Past Surgical History  Procedure Laterality Date  . Tubal ligation    . Ercp  07/02/2011    Procedure: ENDOSCOPIC RETROGRADE CHOLANGIOPANCREATOGRAPHY (ERCP);  Surgeon: Iva Booparl E Gessner, MD;  [facility];  Service: Gastroenterology;  Laterality: N/A;  . Cholecystectomy  07/03/2011    Procedure: LAPAROSCOPIC CHOLECYSTECTOMY;  Surgeon: Jetty DuhamelJames O Wyatt III, MD;  Location: [facility] OR;  Service: General;  Laterality: N/A;   Family History  Problem Relation Age of Onset  . Cancer Mother   . Diabetes Father   . Heart failure Father   . Hypertension Father   . Stroke Father    History  Substance Use Topics  . Smoking status: Current Every Day Smoker -- 1.00 packs/day    Types: Cigarettes  . Smokeless tobacco: Not on file  . Alcohol Use: No   OB History   Grav Para Term Preterm Abortions TAB SAB Ect Mult Living                 Review of Systems  HENT: Positive for dental problem.   All other systems reviewed and are negative.  Allergies   Hydrocodone and Amoxicillin  Home Medications   Prior to Admission medications   Medication Sig Start Date End Date Taking? Authorizing Provider  acetaminophen-codeine (TYLENOL #3) 300-30 MG per tablet Take 1-2 tablets by mouth every 6 (six) hours as needed for moderate pain. 01/24/14   Phill MutterPeter S Dammen, PA-C  albuterol (PROVENTIL HFA;VENTOLIN HFA) 108 (90 BASE) MCG/ACT inhaler Inhale 2 puffs into the lungs every 6 (six) hours as needed for shortness of breath.     Historical Provider, MD  FLUoxetine (PROZAC) 10 MG capsule Take 20 mg by mouth daily.     Historical Provider, MD  gabapentin (NEURONTIN) 300 MG capsule Take 300 mg by mouth 2 (two) times daily.    Historical Provider, MD  omeprazole (PRILOSEC) 20 MG capsule Take 20 mg by mouth daily.    Historical Provider, MD  traMADol (ULTRAM) 50 MG tablet Take 1 tablet (50 mg total) by mouth every 6 (six) hours as needed. 01/24/14   Angus SellerPeter S Dammen, PA-C   LMP 01/27/2014  Physical Exam  Nursing note and vitals reviewed. Constitutional: She is oriented to person, place, and time. She appears well-developed and well-nourished. No distress.  HENT:  Head: Normocephalic and atraumatic.  Right Ear: External ear normal.  Left Ear: External ear normal.  multiple  missing and decayed teeth:no gum or facial swelling noted  Eyes: Conjunctivae and EOM are normal.  Neck: Normal range of motion. No tracheal deviation present.  Cardiovascular: Normal rate, regular rhythm and normal heart sounds.   Pulmonary/Chest: Effort normal and breath sounds normal. No respiratory distress. She has no wheezes. She has no rales.  Musculoskeletal: Normal range of motion.  Neurological: She is alert and oriented to person, place, and time.  Skin: Skin is warm and dry.  Psychiatric: She has a normal mood and affect. Her behavior is normal.    ED Course  Procedures (including critical care time)  DIAGNOSTIC STUDIES:   COORDINATION OF CARE: 5:59 PM-Discussed  treatment plan with pt at bedside and pt agreed to plan.   Labs Review Labs Reviewed - No data to display  Imaging Review No results found.   EKG Interpretation None      MDM   Final diagnoses:  Pain, dental    Pt given ultram for pain:discussed with pt the importance of the dentist  I personally performed the services described in this documentation, which was scribed in my presence. The recorded information has been reviewed and is accurate.  Teressa LowerVrinda Latiffany Harwick, NP 01/27/14 1807

## 2014-01-27 NOTE — ED Notes (Signed)
Patient is here for dental pain. [facility]. Patient was given patient medication, which she said was ineffective. Patient states that her teeth are broke off and dentist has scheduled for her to see surgeon, which is not until 04/17/14. Patient has since found a new surgeon to contact but no appointment. Patient states her gum a red a swollen. She has tried oral gel for pain.

## 2014-01-27 NOTE — Discharge Instructions (Signed)

## 2014-01-27 NOTE — ED Notes (Signed)
Pt started cursing and was unhappy when she realized she was getting a prescription for ultram. Threw prescription and discharge pts away.

## 2014-01-28 NOTE — ED Provider Notes (Signed)
Medical screening examination/treatment/procedure(s) were performed by non-physician practitioner and as supervising physician I was immediately available for consultation/collaboration.   EKG Interpretation None        [name] F Bitha Fauteux, MD 01/28/14 1449 

## 2014-05-25 ENCOUNTER — Encounter (HOSPITAL_COMMUNITY): Payer: Self-pay | Admitting: Emergency Medicine

## 2014-05-25 ENCOUNTER — Emergency Department (HOSPITAL_COMMUNITY)
Admission: EM | Admit: 2014-05-25 | Discharge: 2014-05-25 | Disposition: A | Discharge status: Home / Self Care | Payer: Medicaid Other | Attending: Emergency Medicine | Admitting: Emergency Medicine

## 2014-05-25 DIAGNOSIS — K088 Other specified disorders of teeth and supporting structures: Secondary | ICD-10-CM | POA: Insufficient documentation

## 2014-05-25 DIAGNOSIS — Z8619 Personal history of other infectious and parasitic diseases: Secondary | ICD-10-CM | POA: Diagnosis not present

## 2014-05-25 DIAGNOSIS — Z880 Allergy status to penicillin: Secondary | ICD-10-CM | POA: Insufficient documentation

## 2014-05-25 DIAGNOSIS — K029 Dental caries, unspecified: Secondary | ICD-10-CM | POA: Diagnosis not present

## 2014-05-25 DIAGNOSIS — Z79899 Other long term (current) drug therapy: Secondary | ICD-10-CM | POA: Diagnosis not present

## 2014-05-25 DIAGNOSIS — K0889 Other specified disorders of teeth and supporting structures: Secondary | ICD-10-CM

## 2014-05-25 DIAGNOSIS — Z872 Personal history of diseases of the skin and subcutaneous tissue: Secondary | ICD-10-CM | POA: Diagnosis not present

## 2014-05-25 DIAGNOSIS — Z720 Tobacco use: Secondary | ICD-10-CM | POA: Diagnosis not present

## 2014-05-25 MED ORDER — OXYCODONE-ACETAMINOPHEN 5-325 MG PO TABS: 1.0000 | ORAL_TABLET | Freq: Four times a day (QID) | ORAL | Status: DC | PRN

## 2014-05-25 MED ORDER — NAPROXEN 375 MG PO TABS: 375.0000 mg | ORAL_TABLET | Freq: Two times a day (BID) | ORAL | Status: DC

## 2014-05-25 MED ORDER — CLINDAMYCIN HCL 150 MG PO CAPS: 150.0000 mg | ORAL_CAPSULE | Freq: Four times a day (QID) | ORAL | Status: DC

## 2014-05-25 NOTE — ED Notes (Signed)
Pt reports pain and swelling started on Friday.

## 2014-05-25 NOTE — ED Notes (Signed)
Declined W/C at D/C and was escorted to lobby by RN. 

## 2014-05-25 NOTE — Discharge Instructions (Signed)

## 2014-05-25 NOTE — ED Provider Notes (Signed)
CSN: [id number]     Arrival date & time 05/25/14  4098 History  This chart was scribed for Dorthula Matas, PA, working with Vanetta Mulders, MD found by Elon Spanner, ED Scribe. This patient was seen in room TR08C/TR08C and the patient's care was started at 10:52 AM.   Chief Complaint  Patient presents with  . Dental Pain   The history is provided by the patient. No language interpreter was used.   HPI Comments: Cathy Santos is a 38 y.o. female who presents to the Emergency Department complaining of worsening right upper dental pain and associated facial swelling onset yesterday.  Patient reports she slept on the right side of her face last night.  Patient reports she has been using Orajel without relief.  Patient denies fever, nausea, vomiting, difficulty swallowing.     Past Medical History  Diagnosis Date  . Ulcer   . Hep C w/o coma, chronic    Past Surgical History  Procedure Laterality Date  . Tubal ligation    . Ercp  07/02/2011    Procedure: ENDOSCOPIC RETROGRADE CHOLANGIOPANCREATOGRAPHY (ERCP);  Surgeon: Iva Boop, MD;  Location: [facility] OR;  Service: Gastroenterology;  Laterality: N/A;  . Cholecystectomy  07/03/2011    Procedure: LAPAROSCOPIC CHOLECYSTECTOMY;  Surgeon: Jetty Duhamel, MD;  Location: [facility] OR;  Service: General;  Laterality: N/A;   Family History  Problem Relation Age of Onset  . Cancer Mother   . Diabetes Father   . Heart failure Father   . Hypertension Father   . Stroke Father    History  Substance Use Topics  . Smoking status: Current Every Day Smoker -- 1.00 packs/day    Types: Cigarettes  . Smokeless tobacco: Not on file  . Alcohol Use: No   OB History   Grav Para Term Preterm Abortions TAB SAB Ect Mult Living                 Review of Systems  All other systems reviewed and are negative.     Allergies  Hydrocodone and Amoxicillin  Home Medications   Prior to Admission medications   Medication Sig Start Date End Date Taking?  Authorizing Provider  albuterol (PROVENTIL HFA;VENTOLIN HFA) 108 (90 BASE) MCG/ACT inhaler Inhale 2 puffs into the lungs every 6 (six) hours as needed for shortness of breath.    Yes Historical Provider, MD  benzocaine (ORAJEL) 10 % mucosal gel Use as directed 1 application in the mouth or throat as needed for mouth pain.   Yes Historical Provider, MD  cloNIDine (CATAPRES) 0.1 MG tablet Take 0.1 mg by mouth at bedtime.   Yes Historical Provider, MD  FLUoxetine (PROZAC) 10 MG capsule Take 20 mg by mouth daily.    Yes Historical Provider, MD  gabapentin (NEURONTIN) 300 MG capsule Take 300 mg by mouth 2 (two) times daily.   Yes Historical Provider, MD  omeprazole (PRILOSEC) 20 MG capsule Take 20 mg by mouth daily.   Yes Historical Provider, MD  clindamycin (CLEOCIN) 150 MG capsule Take 1 capsule (150 mg total) by mouth every 6 (six) hours. 05/25/14   Dorthula Matas, PA-C  naproxen (NAPROSYN) 375 MG tablet Take 1 tablet (375 mg total) by mouth 2 (two) times daily. 05/25/14   Dorthula Matas, PA-C  oxyCODONE-acetaminophen (PERCOCET/ROXICET) 5-325 MG per tablet Take 1-2 tablets by mouth every 6 (six) hours as needed. 05/25/14   Dorthula Matas, PA-C   BP 116/63  Pulse 68  Temp(Src)  97.8 F (36.6 C) (Oral)  Resp 16  Ht 5\' 4"  (1.626 m)  Wt 197 lb (89.359 kg)  BMI 33.80 kg/m2  SpO2 97%  LMP 05/25/2014 Physical Exam  Nursing note and vitals reviewed. Constitutional: She is oriented to person, place, and time. She appears well-developed and well-nourished. No distress.  HENT:  Head: Normocephalic and atraumatic.  Mouth/Throat: Uvula is midline, oropharynx is clear and moist and mucous membranes are normal. No trismus in the jaw. Normal dentition. Dental caries (Pts tooth shows no obvious abscess but moderate to severe tenderness to palpation of marked tooth) present. No uvula swelling.  Airway is patent No sublingual swelling No obvious visible abscess,   Eyes: Conjunctivae and EOM are normal.  Pupils are equal, round, and reactive to light.  Neck: Trachea normal, normal range of motion and full passive range of motion without pain. Neck supple. No tracheal deviation present.  Cardiovascular: Normal rate, regular rhythm, normal heart sounds and normal pulses.   Pulmonary/Chest: Effort normal and breath sounds normal. No respiratory distress. Chest wall is not dull to percussion. She exhibits no tenderness, no crepitus, no edema, no deformity and no retraction.  Abdominal: Normal appearance.  Musculoskeletal: Normal range of motion.  Neurological: She is alert and oriented to person, place, and time. She has normal strength.  Skin: Skin is warm, dry and intact. She is not diaphoretic.  Psychiatric: She has a normal mood and affect. Her speech is normal and behavior is normal. Cognition and memory are normal.    ED Course  Procedures (including critical care time)  DIAGNOSTIC STUDIES: Oxygen Saturation is 97% on RA, normal by my interpretation.    COORDINATION OF CARE:  10:54 AM Will provide dental block.  Will prescribe antibiotic and pain medication.  Referral for follow-up care will be provided.    10:57 AM After dental block placed, patient states her pain has improved and rates it as a 3/10  Labs Review Labs Reviewed - No data to display  Imaging Review No results found.   EKG Interpretation None      MDM   Final diagnoses:  Pain, dental   clindamycin (CLEOCIN) 150 MG capsule Take 1 capsule (150 mg total) by mouth every 6 (six) hours. 28 capsule Dorthula Matasiffany G Lennox Dolberry, PA-C naproxen (NAPROSYN) 375 MG tablet Take 1 tablet (375 mg total) by mouth 2 (two) times daily. 20 tablet Dorthula Matasiffany G Ziasia Lenoir, PA-C  oxyCODONE-acetaminophen (PERCOCET/ROXICET) 5-325 MG per tablet Take 1-2 tablets by mouth every 6 (six) hours as needed. 20 tablet Dorthula Matasiffany G Levy Cedano, PA-C  Patient has dental pain. No emergent s/sx's present. Patent airway. No trismus.  Will be given pain medication and  antibiotics. I discussed the need to call dentist within 24/48 hours for follow-up. Dental referral given. Return to ED precautions given.  Pt voiced understanding and has agreed to follow-up.   38 y.o.Cathy DodrillBarbara E Santos's evaluation in the Emergency Department is complete. It has been determined that no acute conditions requiring further emergency intervention are present at this time. The patient/guardian have been advised of the diagnosis and plan. We have discussed signs and symptoms that warrant return to the ED, such as changes or worsening in symptoms.  Vital signs are stable at discharge. Filed Vitals:   05/25/14 1047  BP: 116/63  Pulse: 68  Temp: 97.8 F (36.6 C)  Resp: 16    Patient/guardian has voiced understanding and agreed to follow-up with the PCP or specialist.    I personally performed the services  described in this documentation, which was scribed in my presence. The recorded information has been reviewed and is accurate.    Dorthula Matasiffany G Zayd Bonet, PA-C 05/25/14 1139

## 2014-05-25 NOTE — ED Provider Notes (Signed)
Medical screening examination/treatment/procedure(s) were performed by non-physician practitioner and as supervising physician I was immediately available for consultation/collaboration.   EKG Interpretation None       Troi Bechtold, MD 05/25/14 1646 

## 2014-10-23 ENCOUNTER — Other Ambulatory Visit: Payer: Self-pay | Admitting: Internal Medicine

## 2014-10-23 DIAGNOSIS — N6310 Unspecified lump in the right breast, unspecified quadrant: Principal | ICD-10-CM

## 2014-10-23 DIAGNOSIS — N6315 Unspecified lump in the right breast, overlapping quadrants: Secondary | ICD-10-CM

## 2014-10-28 ENCOUNTER — Ambulatory Visit
Admission: RE | Admit: 2014-10-28 | Discharge: 2014-10-28 | Disposition: A | Discharge status: Home / Self Care | Payer: Medicaid Other | Source: Ambulatory Visit | Attending: Internal Medicine | Admitting: Internal Medicine

## 2014-10-28 DIAGNOSIS — N6315 Unspecified lump in the right breast, overlapping quadrants: Secondary | ICD-10-CM

## 2014-10-28 DIAGNOSIS — N6310 Unspecified lump in the right breast, unspecified quadrant: Principal | ICD-10-CM

## 2014-11-05 ENCOUNTER — Emergency Department (HOSPITAL_COMMUNITY)
Admission: EM | Admit: 2014-11-05 | Discharge: 2014-11-05 | Disposition: A | Discharge status: Home / Self Care | Payer: Medicaid Other | Attending: Emergency Medicine | Admitting: Emergency Medicine

## 2014-11-05 ENCOUNTER — Encounter (HOSPITAL_COMMUNITY): Payer: Self-pay | Admitting: Emergency Medicine

## 2014-11-05 DIAGNOSIS — Z3202 Encounter for pregnancy test, result negative: Secondary | ICD-10-CM | POA: Diagnosis not present

## 2014-11-05 DIAGNOSIS — Z8619 Personal history of other infectious and parasitic diseases: Secondary | ICD-10-CM | POA: Diagnosis not present

## 2014-11-05 DIAGNOSIS — N390 Urinary tract infection, site not specified: Secondary | ICD-10-CM | POA: Diagnosis not present

## 2014-11-05 DIAGNOSIS — Z720 Tobacco use: Secondary | ICD-10-CM | POA: Diagnosis not present

## 2014-11-05 DIAGNOSIS — Z872 Personal history of diseases of the skin and subcutaneous tissue: Secondary | ICD-10-CM | POA: Diagnosis not present

## 2014-11-05 DIAGNOSIS — Z792 Long term (current) use of antibiotics: Secondary | ICD-10-CM | POA: Insufficient documentation

## 2014-11-05 DIAGNOSIS — Z880 Allergy status to penicillin: Secondary | ICD-10-CM | POA: Insufficient documentation

## 2014-11-05 DIAGNOSIS — Z791 Long term (current) use of non-steroidal anti-inflammatories (NSAID): Secondary | ICD-10-CM | POA: Diagnosis not present

## 2014-11-05 DIAGNOSIS — N760 Acute vaginitis: Secondary | ICD-10-CM | POA: Insufficient documentation

## 2014-11-05 DIAGNOSIS — Z79899 Other long term (current) drug therapy: Secondary | ICD-10-CM | POA: Diagnosis not present

## 2014-11-05 DIAGNOSIS — B9689 Other specified bacterial agents as the cause of diseases classified elsewhere: Secondary | ICD-10-CM

## 2014-11-05 DIAGNOSIS — Z9049 Acquired absence of other specified parts of digestive tract: Secondary | ICD-10-CM | POA: Insufficient documentation

## 2014-11-05 DIAGNOSIS — N939 Abnormal uterine and vaginal bleeding, unspecified: Secondary | ICD-10-CM | POA: Diagnosis present

## 2014-11-05 DIAGNOSIS — Z9851 Tubal ligation status: Secondary | ICD-10-CM | POA: Diagnosis not present

## 2014-11-05 HISTORY — DX: Low back pain, unspecified: M54.50

## 2014-11-05 HISTORY — DX: Low back pain: M54.5

## 2014-11-05 LAB — CBC WITH DIFFERENTIAL/PLATELET
BASOS ABS: 0 10*3/uL (ref 0.0–0.1)
Basophils Relative: 0 % (ref 0–1)
EOS ABS: 0.1 10*3/uL (ref 0.0–0.7)
EOS PCT: 1 % (ref 0–5)
HCT: 41.7 % (ref 36.0–46.0)
HEMOGLOBIN: 13.9 g/dL (ref 12.0–15.0)
LYMPHS PCT: 30 % (ref 12–46)
Lymphs Abs: 3.3 10*3/uL (ref 0.7–4.0)
MCH: 31 pg (ref 26.0–34.0)
MCHC: 33.3 g/dL (ref 30.0–36.0)
MCV: 92.9 fL (ref 78.0–100.0)
Monocytes Absolute: 0.5 10*3/uL (ref 0.1–1.0)
Monocytes Relative: 4 % (ref 3–12)
NEUTROS ABS: 7 10*3/uL (ref 1.7–7.7)
NEUTROS PCT: 65 % (ref 43–77)
Platelets: 277 10*3/uL (ref 150–400)
RBC: 4.49 MIL/uL (ref 3.87–5.11)
RDW: 14 % (ref 11.5–15.5)
WBC: 10.8 10*3/uL — AB (ref 4.0–10.5)

## 2014-11-05 LAB — COMPREHENSIVE METABOLIC PANEL
ALT: 16 U/L (ref 0–35)
ANION GAP: 7 (ref 5–15)
AST: 17 U/L (ref 0–37)
Albumin: 4.1 g/dL (ref 3.5–5.2)
Alkaline Phosphatase: 63 U/L (ref 39–117)
BUN: 14 mg/dL (ref 6–23)
CALCIUM: 8.4 mg/dL (ref 8.4–10.5)
CHLORIDE: 107 mmol/L (ref 96–112)
CO2: 26 mmol/L (ref 19–32)
Creatinine, Ser: 0.79 mg/dL (ref 0.50–1.10)
GFR calc Af Amer: >90 mL/min (ref >90)
Glucose, Bld: 90 mg/dL (ref 70–99)
Potassium: 3.6 mmol/L (ref 3.5–5.1)
Sodium: 140 mmol/L (ref 135–145)
Total Bilirubin: 0.4 mg/dL (ref 0.3–1.2)
Total Protein: 7.1 g/dL (ref 6.0–8.3)

## 2014-11-05 LAB — URINALYSIS, ROUTINE W REFLEX MICROSCOPIC
BILIRUBIN URINE: NEGATIVE
Glucose, UA: NEGATIVE mg/dL
Ketones, ur: NEGATIVE mg/dL
Nitrite: NEGATIVE
PROTEIN: NEGATIVE mg/dL
Specific Gravity, Urine: 1.023 (ref 1.005–1.030)
Urobilinogen, UA: 0.2 mg/dL (ref 0.0–1.0)
pH: 6.5 (ref 5.0–8.0)

## 2014-11-05 LAB — URINE MICROSCOPIC-ADD ON

## 2014-11-05 LAB — WET PREP, GENITAL
Trich, Wet Prep: NONE SEEN
Yeast Wet Prep HPF POC: NONE SEEN

## 2014-11-05 LAB — I-STAT BETA HCG BLOOD, ED (MC, WL, AP ONLY)

## 2014-11-05 MED ORDER — IBUPROFEN 400 MG PO TABS
400.0000 mg | ORAL_TABLET | Freq: Four times a day (QID) | ORAL | Status: DC | PRN
Start: 2014-11-05 — End: 2014-11-10

## 2014-11-05 MED ORDER — CEPHALEXIN 500 MG PO CAPS
500.0000 mg | ORAL_CAPSULE | Freq: Two times a day (BID) | ORAL | Status: DC
Start: 2014-11-05 — End: 2015-08-19

## 2014-11-05 MED ORDER — ONDANSETRON HCL 4 MG PO TABS: 4.0000 mg | ORAL_TABLET | Freq: Four times a day (QID) | ORAL | Status: DC

## 2014-11-05 MED ORDER — METRONIDAZOLE 500 MG PO TABS: 500.0000 mg | ORAL_TABLET | Freq: Two times a day (BID) | ORAL | Status: DC

## 2014-11-05 MED ORDER — KETOROLAC TROMETHAMINE 60 MG/2ML IM SOLN
60.0000 mg | Freq: Once | INTRAMUSCULAR | Status: AC
  Administered 2014-11-05: 60 mg via INTRAMUSCULAR
  Filled 2014-11-05: qty 2

## 2014-11-05 NOTE — ED Provider Notes (Signed)
39 year old female with recurrent dysuria and left-sided flank pain. On exam she has no tenderness in the flank, minimal tenderness in the suprapubic region, she has been taking antibiotics for urinary infection by urinalysis reveals ongoing infection. Change antibiotic, otherwise well-appearing, no indications for further workup at this time.  Medical screening examination/treatment/procedure(s) were conducted as a shared visit with non-physician practitioner(s) and myself.  I personally evaluated the patient during the encounter.  Clinical Impression:   Final diagnoses:  UTI (lower urinary tract infection)  BV (bacterial vaginosis)         Eber HongBrian Najat Olazabal, MD 11/06/14 1009

## 2014-11-05 NOTE — Discharge Instructions (Signed)
Please follow-up with your primary care provider in 2 days for recheck of symptoms. Please complete complete doses of antibiotics. She experience new or worsening symptoms return immediately for further evaluation and management.

## 2014-11-05 NOTE — ED Notes (Addendum)
Pt reports L flank pain with radiation down L leg since yesterday am. Recently dx with UTI, has taken 2 doses abx. Pt also has complaining of vaginal bleeding for a week, worse after having sex.

## 2014-11-05 NOTE — ED Provider Notes (Signed)
CSN: [id number]     Arrival date & time 11/05/14  1219 History   First MD Initiated Contact with Patient 11/05/14 1453     Chief Complaint  Patient presents with  . Flank Pain  . Vaginal Bleeding    HPI   39 year old female presents today with urinary frequency, dysuria, back pain, and vaginal bleeding. Patient reports her last normal menstrual period was 7 days ago she reports cessation of vaginal bleeding and that time but 2 days later had intercourse with her husband started again. Patient reports the last 2 weeks she's had dysuria, urinary frequency for which she was seen by her primary care provider. She was given antibiotics which she is uncertain of what they gave her. She again returned yesterday with similar complaints and was given a course of azithromycin for the urinary tract infection. In addition to the urinary symptoms she also reports lower back pain, similar to chronic low back pain. She reports radiation of symptoms down her left leg. No loss of sensation, function, perfusion of the distal extremity. Patient patient reports fatigue, denies fever, chills, abdominal pain, chest pain, shortness of breath, headaches, nausea, vomiting.  Past Medical History  Diagnosis Date  . Ulcer   . Hep C w/o coma, chronic   . Lumbar back pain    Past Surgical History  Procedure Laterality Date  . Tubal ligation    . Ercp  07/02/2011    Procedure: ENDOSCOPIC RETROGRADE CHOLANGIOPANCREATOGRAPHY (ERCP);  Surgeon: Iva Boop, MD;  Location: [facility] OR;  Service: Gastroenterology;  Laterality: N/A;  . Cholecystectomy  07/03/2011    Procedure: LAPAROSCOPIC CHOLECYSTECTOMY;  Surgeon: Jetty Duhamel, MD;  Location: [facility] OR;  Service: General;  Laterality: N/A;   Family History  Problem Relation Age of Onset  . Cancer Mother   . Diabetes Father   . Heart failure Father   . Hypertension Father   . Stroke Father    History  Substance Use Topics  . Smoking status: Current Every Day Smoker --  1.00 packs/day    Types: Cigarettes  . Smokeless tobacco: Not on file  . Alcohol Use: No   OB History    No data available     Review of Systems  All other systems reviewed and are negative.   Allergies  Hydrocodone and Amoxicillin  Home Medications   Prior to Admission medications   Medication Sig Start Date End Date Taking? Authorizing Provider  albuterol (PROVENTIL HFA;VENTOLIN HFA) 108 (90 BASE) MCG/ACT inhaler Inhale 2 puffs into the lungs every 6 (six) hours as needed for shortness of breath.    Yes Historical Provider, MD  azithromycin (ZITHROMAX) 250 MG tablet Take 1 tablet by mouth daily. Take 2 tablets by mouth on day 1. Then take 1 tablet by mouth daily for 4 days. 10/30/14  Yes Historical Provider, MD  benzocaine (ORAJEL) 10 % mucosal gel Use as directed 1 application in the mouth or throat as needed for mouth pain.   Yes Historical Provider, MD  ciprofloxacin (CIPRO) 500 MG tablet Take 500 mg by mouth 2 (two) times daily. 11/04/14  Yes Historical Provider, MD  clonazePAM (KLONOPIN) 0.5 MG tablet Take 0.25 mg by mouth 2 (two) times daily. 10/23/14  Yes Historical Provider, MD  FLUoxetine (PROZAC) 10 MG capsule Take 20 mg by mouth daily.    Yes Historical Provider, MD  gabapentin (NEURONTIN) 800 MG tablet Take 1 tablet by mouth every 6 (six) hours. 10/23/14  Yes Historical Provider, MD  hydrOXYzine (VISTARIL) 25 MG capsule Take 25 mg by mouth 2 (two) times daily. 11/01/14  Yes Historical Provider, MD  lamoTRIgine (LAMICTAL) 150 MG tablet Take 150 mg by mouth daily. 11/01/14  Yes Historical Provider, MD  omeprazole (PRILOSEC) 20 MG capsule Take 20 mg by mouth daily.   Yes Historical Provider, MD  VOLTAREN 1 % GEL Apply 1 application topically 2 (two) times daily. 10/04/14  Yes Historical Provider, MD  cephALEXin (KEFLEX) 500 MG capsule Take 1 capsule (500 mg total) by mouth 2 (two) times daily. 11/05/14   Eyvonne MechanicJeffrey Oluwaferanmi Wain, PA-C  clindamycin (CLEOCIN) 150 MG capsule Take 1 capsule (150  mg total) by mouth every 6 (six) hours. 05/25/14   Tiffany Neva SeatGreene, PA-C  ibuprofen (ADVIL,MOTRIN) 400 MG tablet Take 1 tablet (400 mg total) by mouth every 6 (six) hours as needed. 11/05/14   Eyvonne MechanicJeffrey Daizee Firmin, PA-C  metroNIDAZOLE (FLAGYL) 500 MG tablet Take 1 tablet (500 mg total) by mouth 2 (two) times daily. 11/05/14   Eyvonne MechanicJeffrey Acsa Estey, PA-C  naproxen (NAPROSYN) 375 MG tablet Take 1 tablet (375 mg total) by mouth 2 (two) times daily. 05/25/14   Tiffany Neva SeatGreene, PA-C  ondansetron (ZOFRAN) 4 MG tablet Take 1 tablet (4 mg total) by mouth every 6 (six) hours. 11/05/14   Eyvonne MechanicJeffrey Hidaya Daniel, PA-C  oxyCODONE-acetaminophen (PERCOCET/ROXICET) 5-325 MG per tablet Take 1-2 tablets by mouth every 6 (six) hours as needed. 05/25/14   Tiffany Neva SeatGreene, PA-C   BP 104/69 mmHg  Pulse 69  Temp(Src) 98.6 F (37 C) (Oral)  Resp 18  SpO2 98% Physical Exam  Constitutional: She is oriented to person, place, and time. She appears well-developed and well-nourished.  HENT:  Head: Normocephalic and atraumatic.  Eyes: Pupils are equal, round, and reactive to light.  Neck: Normal range of motion. Neck supple. No JVD present. No tracheal deviation present. No thyromegaly present.  Cardiovascular: Normal rate, regular rhythm, normal heart sounds and intact distal pulses.  Exam reveals no gallop and no friction rub.   No murmur heard. Pulmonary/Chest: Effort normal and breath sounds normal. No stridor. No respiratory distress. She has no wheezes. She has no rales. She exhibits no tenderness.  Abdominal: Soft. Normal appearance and bowel sounds are normal. She exhibits no distension. There is tenderness in the suprapubic area. There is no rigidity, no rebound, no guarding, no CVA tenderness, no tenderness at McBurney's point and negative Murphy's sign.  Genitourinary: There is no rash, tenderness, lesion or injury on the right labia. There is no rash, tenderness, lesion or injury on the left labia. There is bleeding in the vagina. No  erythema or tenderness in the vagina. No foreign body around the vagina. No signs of injury around the vagina. No vaginal discharge found.  Musculoskeletal: Normal range of motion.  Lymphadenopathy:    She has no cervical adenopathy.  Neurological: She is alert and oriented to person, place, and time. Coordination normal.  Skin: Skin is warm and dry.  Psychiatric: She has a normal mood and affect. Her behavior is normal. Judgment and thought content normal.  Nursing note and vitals reviewed.   ED Course  Procedures (including critical care time) Labs Review Labs Reviewed  WET PREP, GENITAL - Abnormal; Notable for the following:    Clue Cells Wet Prep HPF POC FEW (*)    WBC, Wet Prep HPF POC FEW (*)    All other components within normal limits  CBC WITH DIFFERENTIAL/PLATELET - Abnormal; Notable for the following:    WBC 10.8 (*)  All other components within normal limits  URINALYSIS, ROUTINE W REFLEX MICROSCOPIC - Abnormal; Notable for the following:    APPearance CLOUDY (*)    Hgb urine dipstick LARGE (*)    Leukocytes, UA MODERATE (*)    All other components within normal limits  URINE MICROSCOPIC-ADD ON - Abnormal; Notable for the following:    Squamous Epithelial / LPF FEW (*)    Bacteria, UA FEW (*)    All other components within normal limits  GC/CHLAMYDIA PROBE AMP ([place]) - Abnormal; Notable for the following:    Neisseria gonorrhea **NG: POSITIVE** (*)    All other components within normal limits  COMPREHENSIVE METABOLIC PANEL  RPR  HIV ANTIBODY (ROUTINE TESTING)  I-STAT BETA HCG BLOOD, ED ([facility], [facility], [facility] ONLY)  HCG blood negative, CBC 10.8, urinalysis moderate leukocytes cloudy Hgb large, urine microscopic bacteria few,  Imaging Review No results found.   EKG Interpretation None      MDM   Final diagnoses:  UTI (lower urinary tract infection)  BV (bacterial vaginosis)    Labs:HCG blood negative, CBC 10.8, urinalysis moderate leukocytes cloudy Hgb  large, urine microscopic bacteria few,wet prep clue cells  Therapeutics: toradol  Assessment/Plan: Pts presentation consistent with UTI and BV. She was discharged home with instructions to discontinue azithromycin and begin Keflex, and metronidazole therapy.  No concern at this time for pyelonephritis. Pt is instructed to monitor for new or worsening signs of infection including fever, increased pain, increased dysuria, or back pain. She is instructed to follow up with PCP or ED if concerning sings or symptoms present. She understood and agreed to the plan.        Eyvonne Mechanic, PA-C 11/06/14 1513  Eber Hong, MD 11/06/14 [phone]

## 2014-11-06 LAB — GC/CHLAMYDIA PROBE AMP (~~LOC~~) NOT AT ARMC
Chlamydia: NEGATIVE
Neisseria Gonorrhea: POSITIVE — AB

## 2014-11-06 LAB — RPR: RPR Ser Ql: NONREACTIVE

## 2014-11-06 LAB — HIV ANTIBODY (ROUTINE TESTING W REFLEX): HIV Screen 4th Generation wRfx: NONREACTIVE

## 2014-11-10 ENCOUNTER — Emergency Department (HOSPITAL_COMMUNITY)
Admission: EM | Admit: 2014-11-10 | Discharge: 2014-11-10 | Disposition: A | Discharge status: Home / Self Care | Payer: Medicaid Other | Attending: Emergency Medicine | Admitting: Emergency Medicine

## 2014-11-10 ENCOUNTER — Encounter (HOSPITAL_COMMUNITY): Payer: Self-pay | Admitting: Emergency Medicine

## 2014-11-10 DIAGNOSIS — M545 Low back pain: Secondary | ICD-10-CM | POA: Diagnosis not present

## 2014-11-10 DIAGNOSIS — Z880 Allergy status to penicillin: Secondary | ICD-10-CM | POA: Diagnosis not present

## 2014-11-10 DIAGNOSIS — N390 Urinary tract infection, site not specified: Secondary | ICD-10-CM | POA: Insufficient documentation

## 2014-11-10 DIAGNOSIS — Z8619 Personal history of other infectious and parasitic diseases: Secondary | ICD-10-CM | POA: Insufficient documentation

## 2014-11-10 DIAGNOSIS — Z8719 Personal history of other diseases of the digestive system: Secondary | ICD-10-CM | POA: Diagnosis not present

## 2014-11-10 DIAGNOSIS — Z791 Long term (current) use of non-steroidal anti-inflammatories (NSAID): Secondary | ICD-10-CM | POA: Insufficient documentation

## 2014-11-10 DIAGNOSIS — Z720 Tobacco use: Secondary | ICD-10-CM | POA: Insufficient documentation

## 2014-11-10 DIAGNOSIS — Z79899 Other long term (current) drug therapy: Secondary | ICD-10-CM | POA: Diagnosis not present

## 2014-11-10 DIAGNOSIS — M549 Dorsalgia, unspecified: Secondary | ICD-10-CM

## 2014-11-10 DIAGNOSIS — Z792 Long term (current) use of antibiotics: Secondary | ICD-10-CM | POA: Diagnosis not present

## 2014-11-10 LAB — CBC WITH DIFFERENTIAL/PLATELET
Basophils Absolute: 0 10*3/uL (ref 0.0–0.1)
Basophils Relative: 0 % (ref 0–1)
Eosinophils Absolute: 0 10*3/uL (ref 0.0–0.7)
Eosinophils Relative: 0 % (ref 0–5)
HCT: 41.2 % (ref 36.0–46.0)
Hemoglobin: 14 g/dL (ref 12.0–15.0)
LYMPHS ABS: 2.7 10*3/uL (ref 0.7–4.0)
LYMPHS PCT: 25 % (ref 12–46)
MCH: 31 pg (ref 26.0–34.0)
MCHC: 34 g/dL (ref 30.0–36.0)
MCV: 91.2 fL (ref 78.0–100.0)
MONO ABS: 0.4 10*3/uL (ref 0.1–1.0)
Monocytes Relative: 4 % (ref 3–12)
NEUTROS PCT: 71 % (ref 43–77)
Neutro Abs: 7.5 10*3/uL (ref 1.7–7.7)
PLATELETS: 308 10*3/uL (ref 150–400)
RBC: 4.52 MIL/uL (ref 3.87–5.11)
RDW: 13.8 % (ref 11.5–15.5)
WBC: 10.6 10*3/uL — ABNORMAL HIGH (ref 4.0–10.5)

## 2014-11-10 LAB — BASIC METABOLIC PANEL
Anion gap: 8 (ref 5–15)
BUN: 10 mg/dL (ref 6–23)
CALCIUM: 8.6 mg/dL (ref 8.4–10.5)
CHLORIDE: 109 mmol/L (ref 96–112)
CO2: 24 mmol/L (ref 19–32)
Creatinine, Ser: 0.81 mg/dL (ref 0.50–1.10)
GFR calc Af Amer: >90 mL/min (ref >90)
Glucose, Bld: 89 mg/dL (ref 70–99)
Potassium: 3.3 mmol/L — ABNORMAL LOW (ref 3.5–5.1)
Sodium: 141 mmol/L (ref 135–145)

## 2014-11-10 MED ORDER — OXYCODONE-ACETAMINOPHEN 5-325 MG PO TABS
1.0000 | ORAL_TABLET | Freq: Once | ORAL | Status: AC
  Administered 2014-11-10: 1 via ORAL
  Filled 2014-11-10: qty 1

## 2014-11-10 MED ORDER — NAPROXEN 375 MG PO TABS
375.0000 mg | ORAL_TABLET | Freq: Two times a day (BID) | ORAL | Status: DC
Start: 2014-11-10 — End: 2015-08-19

## 2014-11-10 MED ORDER — SULFAMETHOXAZOLE-TRIMETHOPRIM 800-160 MG PO TABS: 1.0000 | ORAL_TABLET | Freq: Two times a day (BID) | ORAL | Status: DC

## 2014-11-10 MED ORDER — ONDANSETRON HCL 4 MG PO TABS: 4.0000 mg | ORAL_TABLET | Freq: Four times a day (QID) | ORAL | Status: DC

## 2014-11-10 NOTE — ED Notes (Signed)
Received pt via EMS with c/o UTI diagnosed 3 weeks ago. Pt seen and treated for UTI with antibiotics. Pt feels UTI not resolving. Pt c/o low back pain. Pt ambulatory with EMS.

## 2014-11-10 NOTE — ED Provider Notes (Signed)
CSN: [id number]     Arrival date & time 11/10/14  1145 History   First MD Initiated Contact with Patient 11/10/14 1204     Chief Complaint  Patient presents with  . Urinary Tract Infection  . Back Pain    HPI Patient was recently seen in the emergency department on March 22 for trouble with frequency, dysuria, back pain and vaginal bleeding. I waited in the emergency department and ultimately diagnosed with a urinary tract infection. She was given a prescription for Keflex. Patient states she can still continues to have burning when she urinates. She is having persistent low back pain. She denies any trouble with fevers or chills, nausea or vomiting. Past Medical History  Diagnosis Date  . Ulcer   . Hep C w/o coma, chronic   . Lumbar back pain    Past Surgical History  Procedure Laterality Date  . Tubal ligation    . Ercp  07/02/2011    Procedure: ENDOSCOPIC RETROGRADE CHOLANGIOPANCREATOGRAPHY (ERCP);  Surgeon: Iva Boop, MD;  Location: [facility] OR;  Service: Gastroenterology;  Laterality: N/A;  . Cholecystectomy  07/03/2011    Procedure: LAPAROSCOPIC CHOLECYSTECTOMY;  Surgeon: Jetty Duhamel, MD;  Location: [facility] OR;  Service: General;  Laterality: N/A;   Family History  Problem Relation Age of Onset  . Cancer Mother   . Diabetes Father   . Heart failure Father   . Hypertension Father   . Stroke Father    History  Substance Use Topics  . Smoking status: Current Every Day Smoker -- 1.00 packs/day    Types: Cigarettes  . Smokeless tobacco: Not on file  . Alcohol Use: No   OB History    No data available     Review of Systems  All other systems reviewed and are negative.     Allergies  Hydrocodone and Amoxicillin  Home Medications   Prior to Admission medications   Medication Sig Start Date End Date Taking? Authorizing Provider  albuterol (PROVENTIL HFA;VENTOLIN HFA) 108 (90 BASE) MCG/ACT inhaler Inhale 2 puffs into the lungs every 6 (six) hours as needed for  shortness of breath.     Historical Provider, MD  azithromycin (ZITHROMAX) 250 MG tablet Take 1 tablet by mouth daily. Take 2 tablets by mouth on day 1. Then take 1 tablet by mouth daily for 4 days. 10/30/14   Historical Provider, MD  benzocaine (ORAJEL) 10 % mucosal gel Use as directed 1 application in the mouth or throat as needed for mouth pain.    Historical Provider, MD  cephALEXin (KEFLEX) 500 MG capsule Take 1 capsule (500 mg total) by mouth 2 (two) times daily. 11/05/14   Eyvonne Mechanic, PA-C  ciprofloxacin (CIPRO) 500 MG tablet Take 500 mg by mouth 2 (two) times daily. 11/04/14   Historical Provider, MD  clindamycin (CLEOCIN) 150 MG capsule Take 1 capsule (150 mg total) by mouth every 6 (six) hours. 05/25/14   Tiffany Neva Seat, PA-C  clonazePAM (KLONOPIN) 0.5 MG tablet Take 0.25 mg by mouth 2 (two) times daily. 10/23/14   Historical Provider, MD  FLUoxetine (PROZAC) 10 MG capsule Take 20 mg by mouth daily.     Historical Provider, MD  gabapentin (NEURONTIN) 800 MG tablet Take 1 tablet by mouth every 6 (six) hours. 10/23/14   Historical Provider, MD  hydrOXYzine (VISTARIL) 25 MG capsule Take 25 mg by mouth 2 (two) times daily. 11/01/14   Historical Provider, MD  ibuprofen (ADVIL,MOTRIN) 400 MG tablet Take 1 tablet (400 mg  total) by mouth every 6 (six) hours as needed. 11/05/14   Eyvonne MechanicJeffrey Hedges, PA-C  lamoTRIgine (LAMICTAL) 150 MG tablet Take 150 mg by mouth daily. 11/01/14   Historical Provider, MD  metroNIDAZOLE (FLAGYL) 500 MG tablet Take 1 tablet (500 mg total) by mouth 2 (two) times daily. 11/05/14   Eyvonne MechanicJeffrey Hedges, PA-C  naproxen (NAPROSYN) 375 MG tablet Take 1 tablet (375 mg total) by mouth 2 (two) times daily. 05/25/14   Tiffany Neva SeatGreene, PA-C  omeprazole (PRILOSEC) 20 MG capsule Take 20 mg by mouth daily.    Historical Provider, MD  ondansetron (ZOFRAN) 4 MG tablet Take 1 tablet (4 mg total) by mouth every 6 (six) hours. 11/05/14   Eyvonne MechanicJeffrey Hedges, PA-C  oxyCODONE-acetaminophen (PERCOCET/ROXICET)  5-325 MG per tablet Take 1-2 tablets by mouth every 6 (six) hours as needed. 05/25/14   Tiffany Neva SeatGreene, PA-C  VOLTAREN 1 % GEL Apply 1 application topically 2 (two) times daily. 10/04/14   Historical Provider, MD   BP 111/65 mmHg  Pulse 80  Temp(Src) 98.5 F (36.9 C) (Oral)  Resp 18  Ht 5\' 4"  (1.626 m)  Wt 180 lb (81.647 kg)  BMI 30.88 kg/m2  SpO2 99%  LMP 10/13/2014 (Approximate) Physical Exam  Constitutional: She appears well-developed and well-nourished. No distress.  HENT:  Head: Normocephalic and atraumatic.  Right Ear: External ear normal.  Left Ear: External ear normal.  Eyes: Conjunctivae are normal. Right eye exhibits no discharge. Left eye exhibits no discharge. No scleral icterus.  Neck: Neck supple. No tracheal deviation present.  Cardiovascular: Normal rate, regular rhythm and intact distal pulses.   Pulmonary/Chest: Effort normal and breath sounds normal. No stridor. No respiratory distress. She has no wheezes. She has no rales.  Abdominal: Soft. Bowel sounds are normal. She exhibits no distension. There is tenderness (suprapubic region). There is no rebound and no guarding.  Musculoskeletal: She exhibits no edema.       Lumbar back: She exhibits tenderness. She exhibits no swelling and no edema.       Back:  Neurological: She is alert. She has normal strength. No cranial nerve deficit (no facial droop, extraocular movements intact, no slurred speech) or sensory deficit. She exhibits normal muscle tone. She displays no seizure activity. Coordination normal.  Skin: Skin is warm and dry. No rash noted.  Psychiatric: She has a normal mood and affect.  Nursing note and vitals reviewed.   ED Course  Procedures (including critical care time) Labs Review Labs Reviewed  CBC WITH DIFFERENTIAL/PLATELET - Abnormal; Notable for the following:    WBC 10.6 (*)    All other components within normal limits  BASIC METABOLIC PANEL - Abnormal; Notable for the following:     Potassium 3.3 (*)    All other components within normal limits  URINE CULTURE      MDM   Final diagnoses:  UTI (lower urinary tract infection)  Back pain, unspecified location    Pt previously diagnosed with UTI.  Urine culture not sent on last visit.  Will change abx regiment.  Follow up with PCP.  Doubt pyelo    Linwood DibblesJon Kobe Jansma, MD 11/12/14 [phone]

## 2014-11-11 LAB — URINE CULTURE

## 2014-11-19 ENCOUNTER — Emergency Department (HOSPITAL_COMMUNITY)
Admission: EM | Admit: 2014-11-19 | Discharge: 2014-11-19 | Disposition: A | Discharge status: Home / Self Care | Payer: Medicaid Other | Attending: Emergency Medicine | Admitting: Emergency Medicine

## 2014-11-19 ENCOUNTER — Encounter (HOSPITAL_COMMUNITY): Payer: Self-pay | Admitting: Family Medicine

## 2014-11-19 DIAGNOSIS — Z720 Tobacco use: Secondary | ICD-10-CM | POA: Diagnosis not present

## 2014-11-19 DIAGNOSIS — Z8619 Personal history of other infectious and parasitic diseases: Secondary | ICD-10-CM | POA: Insufficient documentation

## 2014-11-19 DIAGNOSIS — Z880 Allergy status to penicillin: Secondary | ICD-10-CM | POA: Insufficient documentation

## 2014-11-19 DIAGNOSIS — R1013 Epigastric pain: Secondary | ICD-10-CM | POA: Diagnosis not present

## 2014-11-19 DIAGNOSIS — Z3202 Encounter for pregnancy test, result negative: Secondary | ICD-10-CM | POA: Insufficient documentation

## 2014-11-19 DIAGNOSIS — Z872 Personal history of diseases of the skin and subcutaneous tissue: Secondary | ICD-10-CM | POA: Diagnosis not present

## 2014-11-19 DIAGNOSIS — Z79899 Other long term (current) drug therapy: Secondary | ICD-10-CM | POA: Diagnosis not present

## 2014-11-19 DIAGNOSIS — Z9851 Tubal ligation status: Secondary | ICD-10-CM | POA: Insufficient documentation

## 2014-11-19 DIAGNOSIS — R1084 Generalized abdominal pain: Secondary | ICD-10-CM | POA: Insufficient documentation

## 2014-11-19 DIAGNOSIS — Z792 Long term (current) use of antibiotics: Secondary | ICD-10-CM | POA: Diagnosis not present

## 2014-11-19 DIAGNOSIS — Z791 Long term (current) use of non-steroidal anti-inflammatories (NSAID): Secondary | ICD-10-CM | POA: Insufficient documentation

## 2014-11-19 DIAGNOSIS — R109 Unspecified abdominal pain: Secondary | ICD-10-CM | POA: Diagnosis present

## 2014-11-19 DIAGNOSIS — R112 Nausea with vomiting, unspecified: Secondary | ICD-10-CM | POA: Diagnosis not present

## 2014-11-19 DIAGNOSIS — Z9089 Acquired absence of other organs: Secondary | ICD-10-CM | POA: Insufficient documentation

## 2014-11-19 LAB — COMPREHENSIVE METABOLIC PANEL
ALK PHOS: 59 U/L (ref 39–117)
ALT: 17 U/L (ref 0–35)
ANION GAP: 7 (ref 5–15)
AST: 16 U/L (ref 0–37)
Albumin: 3.9 g/dL (ref 3.5–5.2)
BILIRUBIN TOTAL: 0.7 mg/dL (ref 0.3–1.2)
BUN: 7 mg/dL (ref 6–23)
CHLORIDE: 111 mmol/L (ref 96–112)
CO2: 23 mmol/L (ref 19–32)
Calcium: 8.7 mg/dL (ref 8.4–10.5)
Creatinine, Ser: 0.67 mg/dL (ref 0.50–1.10)
GFR calc Af Amer: >90 mL/min (ref >90)
GFR calc non Af Amer: >90 mL/min (ref >90)
GLUCOSE: 96 mg/dL (ref 70–99)
Potassium: 3.4 mmol/L — ABNORMAL LOW (ref 3.5–5.1)
Sodium: 141 mmol/L (ref 135–145)
Total Protein: 6.8 g/dL (ref 6.0–8.3)

## 2014-11-19 LAB — CBC WITH DIFFERENTIAL/PLATELET
BASOS PCT: 0 % (ref 0–1)
Basophils Absolute: 0 10*3/uL (ref 0.0–0.1)
EOS ABS: 0 10*3/uL (ref 0.0–0.7)
Eosinophils Relative: 0 % (ref 0–5)
HEMATOCRIT: 38.7 % (ref 36.0–46.0)
Hemoglobin: 12.9 g/dL (ref 12.0–15.0)
LYMPHS ABS: 2.7 10*3/uL (ref 0.7–4.0)
Lymphocytes Relative: 27 % (ref 12–46)
MCH: 30.1 pg (ref 26.0–34.0)
MCHC: 33.3 g/dL (ref 30.0–36.0)
MCV: 90.2 fL (ref 78.0–100.0)
Monocytes Absolute: 0.3 10*3/uL (ref 0.1–1.0)
Monocytes Relative: 3 % (ref 3–12)
Neutro Abs: 6.9 10*3/uL (ref 1.7–7.7)
Neutrophils Relative %: 69 % (ref 43–77)
PLATELETS: 294 10*3/uL (ref 150–400)
RBC: 4.29 MIL/uL (ref 3.87–5.11)
RDW: 14.5 % (ref 11.5–15.5)
WBC: 10 10*3/uL (ref 4.0–10.5)

## 2014-11-19 LAB — URINALYSIS, ROUTINE W REFLEX MICROSCOPIC
Bilirubin Urine: NEGATIVE
Glucose, UA: NEGATIVE mg/dL
KETONES UR: NEGATIVE mg/dL
Leukocytes, UA: NEGATIVE
NITRITE: NEGATIVE
Protein, ur: NEGATIVE mg/dL
Specific Gravity, Urine: 1.01 (ref 1.005–1.030)
UROBILINOGEN UA: 0.2 mg/dL (ref 0.0–1.0)
pH: 7 (ref 5.0–8.0)

## 2014-11-19 LAB — URINE MICROSCOPIC-ADD ON

## 2014-11-19 LAB — PREGNANCY, URINE: Preg Test, Ur: NEGATIVE

## 2014-11-19 LAB — LIPASE, BLOOD: Lipase: 24 U/L (ref 11–59)

## 2014-11-19 MED ORDER — SODIUM CHLORIDE 0.9 % IV BOLUS (SEPSIS)
1000.0000 mL | Freq: Once | INTRAVENOUS | Status: AC
  Administered 2014-11-19: 1000 mL via INTRAVENOUS

## 2014-11-19 MED ORDER — GI COCKTAIL ~~LOC~~
30.0000 mL | Freq: Once | ORAL | Status: AC
  Administered 2014-11-19: 30 mL via ORAL
  Filled 2014-11-19: qty 30

## 2014-11-19 MED ORDER — ONDANSETRON HCL 4 MG PO TABS: 4.0000 mg | ORAL_TABLET | Freq: Three times a day (TID) | ORAL | Status: DC | PRN

## 2014-11-19 MED ORDER — HYDROMORPHONE HCL 1 MG/ML IJ SOLN
1.0000 mg | Freq: Once | INTRAMUSCULAR | Status: AC
  Administered 2014-11-19: 1 mg via INTRAVENOUS
  Filled 2014-11-19: qty 1

## 2014-11-19 MED ORDER — SODIUM CHLORIDE 0.9 % IV BOLUS (SEPSIS)
1000.0000 mL | Freq: Once | INTRAVENOUS | Status: AC
Start: 2014-11-19 — End: 2014-11-19
  Administered 2014-11-19: 1000 mL via INTRAVENOUS

## 2014-11-19 MED ORDER — PROMETHAZINE HCL 25 MG RE SUPP: 25.0000 mg | Freq: Three times a day (TID) | RECTAL | Status: DC | PRN

## 2014-11-19 MED ORDER — PANTOPRAZOLE SODIUM 40 MG IV SOLR
40.0000 mg | Freq: Once | INTRAVENOUS | Status: AC
  Administered 2014-11-19: 40 mg via INTRAVENOUS
  Filled 2014-11-19: qty 40

## 2014-11-19 MED ORDER — ONDANSETRON HCL 4 MG/2ML IJ SOLN
4.0000 mg | Freq: Once | INTRAMUSCULAR | Status: AC
  Administered 2014-11-19: 4 mg via INTRAVENOUS
  Filled 2014-11-19: qty 2

## 2014-11-19 NOTE — Discharge Instructions (Signed)

## 2014-11-19 NOTE — ED Notes (Signed)
Pt presents from home via [facility] with c/o nausea x1 month and generalized abdominal pain x1 day.  Pt reports was see a week ago for a UTI and was given Zofran for her nausea without relief.  Pt states this week she has been even more nauseated, unable to keep anything down and had approx 2 episodes of diarrhea in the last 24 hours.  Pt also reports that last night she began having generalized abdominal pain. Dr. Donnald GarrePfeiffer at bedside.

## 2014-11-19 NOTE — ED Provider Notes (Signed)
CSN: [id number]     Arrival date & time 11/19/14  0941 History   First MD Initiated Contact with Patient 11/19/14 1022     Chief Complaint  Patient presents with  . Nausea  . Abdominal Pain     (Consider location/radiation/quality/duration/timing/severity/associated sxs/prior Treatment) HPI Cathy Santos is a 39 year old female past medical history of hepatitis C who presents the ER complaining of nausea, abdominal pain. Patient reports she has been experiencing constant nausea over the past 2 months. She reports onset of upper abdominal pain beginning yesterday. Patient reports worsening of her nausea with past several days, and states that she is been able to keep food down or fluids down or the past 24 hours. Patient states her pain is constant, is made worse with eating. Patient states she was seen and evaluated for urinary tract infection over the past week, he states her dysuria and lower back pain and have since improved. She states her nausea was present prior to this UTI, and has been since persisting. Patient states she has had minimal to no relief with her prescription for Zofran at home. Patient denies chest pain, shortness of breath, headache, dizziness, weakness, vaginal pain, vaginal discharge, dysuria, melena, hematochezia.  Past Medical History  Diagnosis Date  . Ulcer   . Hep C w/o coma, chronic   . Lumbar back pain    Past Surgical History  Procedure Laterality Date  . Tubal ligation    . Ercp  07/02/2011    Procedure: ENDOSCOPIC RETROGRADE CHOLANGIOPANCREATOGRAPHY (ERCP);  Surgeon: Iva Boop, MD;  Location: [facility] OR;  Service: Gastroenterology;  Laterality: N/A;  . Cholecystectomy  07/03/2011    Procedure: LAPAROSCOPIC CHOLECYSTECTOMY;  Surgeon: Jetty Duhamel, MD;  Location: [facility] OR;  Service: General;  Laterality: N/A;   Family History  Problem Relation Age of Onset  . Cancer Mother   . Diabetes Father   . Heart failure Father   . Hypertension Father   .  Stroke Father    History  Substance Use Topics  . Smoking status: Current Every Day Smoker -- 1.00 packs/day    Types: Cigarettes  . Smokeless tobacco: Not on file  . Alcohol Use: No   OB History    No data available     Review of Systems  Constitutional: Negative for fever.  HENT: Negative for trouble swallowing.   Eyes: Negative for visual disturbance.  Respiratory: Negative for shortness of breath.   Cardiovascular: Negative for chest pain.  Gastrointestinal: Positive for nausea, vomiting and abdominal pain.  Genitourinary: Negative for dysuria.  Musculoskeletal: Negative for neck pain.  Skin: Negative for rash.  Neurological: Negative for dizziness, weakness and numbness.  Psychiatric/Behavioral: Negative.       Allergies  Hydrocodone and Amoxicillin  Home Medications   Prior to Admission medications   Medication Sig Start Date End Date Taking? Authorizing Provider  clonazePAM (KLONOPIN) 0.5 MG tablet Take 0.25 mg by mouth 2 (two) times daily. 10/23/14  Yes Historical Provider, MD  Cyanocobalamin (B-12 PO) Take 1 tablet by mouth daily.   Yes Historical Provider, MD  FLUoxetine (PROZAC) 10 MG capsule Take 20 mg by mouth daily.    Yes Historical Provider, MD  gabapentin (NEURONTIN) 800 MG tablet Take 1 tablet by mouth every 6 (six) hours. 10/23/14  Yes Historical Provider, MD  hydrOXYzine (VISTARIL) 25 MG capsule Take 25 mg by mouth 2 (two) times daily. 11/01/14  Yes Historical Provider, MD  lamoTRIgine (LAMICTAL) 150 MG tablet Take 150 mg  by mouth daily. 11/01/14  Yes Historical Provider, MD  omeprazole (PRILOSEC) 20 MG capsule Take 20 mg by mouth daily.   Yes Historical Provider, MD  VOLTAREN 1 % GEL Apply 1 application topically 2 (two) times daily. 10/04/14  Yes Historical Provider, MD  albuterol (PROVENTIL HFA;VENTOLIN HFA) 108 (90 BASE) MCG/ACT inhaler Inhale 2 puffs into the lungs every 6 (six) hours as needed for shortness of breath.     Historical Provider, MD   azithromycin (ZITHROMAX) 250 MG tablet Take 1 tablet by mouth daily. Take 2 tablets by mouth on day 1. Then take 1 tablet by mouth daily for 4 days. 10/30/14   Historical Provider, MD  benzocaine (ORAJEL) 10 % mucosal gel Use as directed 1 application in the mouth or throat as needed for mouth pain.    Historical Provider, MD  cephALEXin (KEFLEX) 500 MG capsule Take 1 capsule (500 mg total) by mouth 2 (two) times daily. Patient not taking: Reported on 11/19/2014 11/05/14   Eyvonne MechanicJeffrey Hedges, PA-C  ciprofloxacin (CIPRO) 500 MG tablet Take 500 mg by mouth 2 (two) times daily. 11/04/14   Historical Provider, MD  clindamycin (CLEOCIN) 150 MG capsule Take 1 capsule (150 mg total) by mouth every 6 (six) hours. Patient not taking: Reported on 11/19/2014 05/25/14   Marlon Peliffany Greene, PA-C  metroNIDAZOLE (FLAGYL) 500 MG tablet Take 1 tablet (500 mg total) by mouth 2 (two) times daily. Patient not taking: Reported on 11/19/2014 11/05/14   Eyvonne MechanicJeffrey Hedges, PA-C  naproxen (NAPROSYN) 375 MG tablet Take 1 tablet (375 mg total) by mouth 2 (two) times daily. Patient not taking: Reported on 11/19/2014 11/10/14   Linwood DibblesJon Knapp, MD  oxyCODONE-acetaminophen (PERCOCET/ROXICET) 5-325 MG per tablet Take 1-2 tablets by mouth every 6 (six) hours as needed. Patient not taking: Reported on 11/19/2014 05/25/14   Marlon Peliffany Greene, PA-C  promethazine (PHENERGAN) 25 MG suppository Place 1 suppository (25 mg total) rectally every 8 (eight) hours as needed for nausea or vomiting. 11/19/14   Ladona MowJoe Syenna Nazir, PA-C  sulfamethoxazole-trimethoprim (BACTRIM DS,SEPTRA DS) 800-160 MG per tablet Take 1 tablet by mouth 2 (two) times daily. 11/10/14   Linwood DibblesJon Knapp, MD   BP 112/81 mmHg  Pulse 70  Temp(Src) 98.2 F (36.8 C) (Oral)  Resp 16  SpO2 99%  LMP 10/13/2014 (Approximate) Physical Exam  Constitutional: She is oriented to person, place, and time. She appears well-developed and well-nourished. No distress.  HENT:  Head: Normocephalic and atraumatic.  Mouth/Throat:  Oropharynx is clear and moist. No oropharyngeal exudate.  Eyes: Right eye exhibits no discharge. Left eye exhibits no discharge. No scleral icterus.  Neck: Normal range of motion.  Cardiovascular: Normal rate, regular rhythm and normal heart sounds.   No murmur heard. Pulmonary/Chest: Effort normal and breath sounds normal. No respiratory distress.  Abdominal: Soft. Normal appearance and bowel sounds are normal. There is tenderness in the epigastric area. There is no rigidity, no guarding, no tenderness at McBurney's point and negative Murphy's sign.  Generalized abdominal tenderness which is mild, slightly more apparent in epigastric region.  Musculoskeletal: Normal range of motion. She exhibits no edema or tenderness.  Neurological: She is alert and oriented to person, place, and time. No cranial nerve deficit. Coordination normal.  Skin: Skin is warm and dry. No rash noted. She is not diaphoretic.  Psychiatric: She has a normal mood and affect.  Nursing note and vitals reviewed.   ED Course  Procedures (including critical care time) Labs Review Labs Reviewed  COMPREHENSIVE METABOLIC PANEL - Abnormal;  Notable for the following:    Potassium 3.4 (*)    All other components within normal limits  URINALYSIS, ROUTINE W REFLEX MICROSCOPIC - Abnormal; Notable for the following:    Hgb urine dipstick LARGE (*)    All other components within normal limits  URINE MICROSCOPIC-ADD ON - Abnormal; Notable for the following:    Squamous Epithelial / LPF FEW (*)    All other components within normal limits  CBC WITH DIFFERENTIAL/PLATELET  LIPASE, BLOOD  PREGNANCY, URINE    Imaging Review No results found.   EKG Interpretation None      MDM   Final diagnoses:  Epigastric pain  Non-intractable vomiting with nausea, vomiting of unspecified type    Patient is nontoxic, nonseptic appearing, in no apparent distress.  Patient's pain and other symptoms adequately managed in emergency  department.  Fluid bolus given.  Labs, imaging and vitals reviewed.  Patient does not meet the SIRS or Sepsis criteria.  On repeat exam patient does not have a surgical abdomin and there are no peritoneal signs.  Patient tolerating by mouth well here. No indication of appendicitis, bowel obstruction, bowel perforation, cholecystitis, diverticulitis, PID or ectopic pregnancy.  Patient discharged home with symptomatic treatment and given strict instructions for follow-up with their primary care physician.  I have also discussed reasons to return immediately to the ER.  Patient expresses understanding and agrees with plan.  BP 112/81 mmHg  Pulse 70  Temp(Src) 98.2 F (36.8 C) (Oral)  Resp 16  SpO2 99%  LMP 10/13/2014 (Approximate)  Signed,  Ladona Mow, PA-C 5:59 PM  Patient seen and discussed with Dr. Arby Barrette, MD who agrees with above tx and plan.           Ladona Mow, PA-C 11/19/14 1759  Arby Barrette, MD 11/23/14 [phone]

## 2014-12-11 ENCOUNTER — Other Ambulatory Visit: Payer: Self-pay | Admitting: Physician Assistant

## 2014-12-11 DIAGNOSIS — R1013 Epigastric pain: Secondary | ICD-10-CM

## 2014-12-12 ENCOUNTER — Encounter (HOSPITAL_COMMUNITY): Payer: Self-pay | Admitting: Emergency Medicine

## 2014-12-12 ENCOUNTER — Emergency Department (HOSPITAL_COMMUNITY)
Admission: EM | Admit: 2014-12-12 | Discharge: 2014-12-12 | Disposition: A | Discharge status: Home / Self Care | Payer: Medicaid Other | Attending: Emergency Medicine | Admitting: Emergency Medicine

## 2014-12-12 DIAGNOSIS — F1290 Cannabis use, unspecified, uncomplicated: Secondary | ICD-10-CM | POA: Diagnosis not present

## 2014-12-12 DIAGNOSIS — F329 Major depressive disorder, single episode, unspecified: Secondary | ICD-10-CM | POA: Diagnosis not present

## 2014-12-12 DIAGNOSIS — R1013 Epigastric pain: Secondary | ICD-10-CM | POA: Diagnosis not present

## 2014-12-12 DIAGNOSIS — G8929 Other chronic pain: Secondary | ICD-10-CM | POA: Insufficient documentation

## 2014-12-12 DIAGNOSIS — R630 Anorexia: Secondary | ICD-10-CM | POA: Diagnosis not present

## 2014-12-12 DIAGNOSIS — F32A Depression, unspecified: Secondary | ICD-10-CM

## 2014-12-12 DIAGNOSIS — Z880 Allergy status to penicillin: Secondary | ICD-10-CM | POA: Diagnosis not present

## 2014-12-12 DIAGNOSIS — Z8719 Personal history of other diseases of the digestive system: Secondary | ICD-10-CM | POA: Diagnosis not present

## 2014-12-12 DIAGNOSIS — Z79899 Other long term (current) drug therapy: Secondary | ICD-10-CM | POA: Diagnosis not present

## 2014-12-12 DIAGNOSIS — F1190 Opioid use, unspecified, uncomplicated: Secondary | ICD-10-CM | POA: Diagnosis not present

## 2014-12-12 DIAGNOSIS — Z8619 Personal history of other infectious and parasitic diseases: Secondary | ICD-10-CM | POA: Insufficient documentation

## 2014-12-12 DIAGNOSIS — Z3202 Encounter for pregnancy test, result negative: Secondary | ICD-10-CM | POA: Diagnosis not present

## 2014-12-12 DIAGNOSIS — F1490 Cocaine use, unspecified, uncomplicated: Secondary | ICD-10-CM | POA: Diagnosis not present

## 2014-12-12 DIAGNOSIS — R109 Unspecified abdominal pain: Secondary | ICD-10-CM | POA: Diagnosis present

## 2014-12-12 DIAGNOSIS — R112 Nausea with vomiting, unspecified: Secondary | ICD-10-CM | POA: Diagnosis not present

## 2014-12-12 DIAGNOSIS — Z720 Tobacco use: Secondary | ICD-10-CM | POA: Diagnosis not present

## 2014-12-12 LAB — CBC WITH DIFFERENTIAL/PLATELET
Basophils Absolute: 0 10*3/uL (ref 0.0–0.1)
Basophils Relative: 0 % (ref 0–1)
Eosinophils Absolute: 0 10*3/uL (ref 0.0–0.7)
Eosinophils Relative: 0 % (ref 0–5)
HCT: 39.9 % (ref 36.0–46.0)
Hemoglobin: 13.6 g/dL (ref 12.0–15.0)
Lymphocytes Relative: 26 % (ref 12–46)
Lymphs Abs: 3 10*3/uL (ref 0.7–4.0)
MCH: 30.7 pg (ref 26.0–34.0)
MCHC: 34.1 g/dL (ref 30.0–36.0)
MCV: 90.1 fL (ref 78.0–100.0)
Monocytes Absolute: 0.6 10*3/uL (ref 0.1–1.0)
Monocytes Relative: 5 % (ref 3–12)
Neutro Abs: 8 10*3/uL — ABNORMAL HIGH (ref 1.7–7.7)
Neutrophils Relative %: 69 % (ref 43–77)
Platelets: 262 10*3/uL (ref 150–400)
RBC: 4.43 MIL/uL (ref 3.87–5.11)
RDW: 13.9 % (ref 11.5–15.5)
WBC: 11.6 10*3/uL — ABNORMAL HIGH (ref 4.0–10.5)

## 2014-12-12 LAB — URINE MICROSCOPIC-ADD ON

## 2014-12-12 LAB — LIPASE, BLOOD: Lipase: 24 U/L (ref 11–59)

## 2014-12-12 LAB — RAPID URINE DRUG SCREEN, HOSP PERFORMED
Amphetamines: NOT DETECTED
Barbiturates: NOT DETECTED
Benzodiazepines: NOT DETECTED
Cocaine: POSITIVE — AB
Opiates: POSITIVE — AB
Tetrahydrocannabinol: POSITIVE — AB

## 2014-12-12 LAB — URINALYSIS, ROUTINE W REFLEX MICROSCOPIC
Bilirubin Urine: NEGATIVE
Glucose, UA: NEGATIVE mg/dL
Ketones, ur: 40 mg/dL — AB
Leukocytes, UA: NEGATIVE
Nitrite: NEGATIVE
Protein, ur: NEGATIVE mg/dL
Specific Gravity, Urine: 1.012 (ref 1.005–1.030)
Urobilinogen, UA: 1 mg/dL (ref 0.0–1.0)
pH: 7 (ref 5.0–8.0)

## 2014-12-12 LAB — COMPREHENSIVE METABOLIC PANEL
ALT: 24 U/L (ref 0–35)
AST: 18 U/L (ref 0–37)
Albumin: 3.8 g/dL (ref 3.5–5.2)
Alkaline Phosphatase: 70 U/L (ref 39–117)
Anion gap: 8 (ref 5–15)
BUN: 8 mg/dL (ref 6–23)
CO2: 23 mmol/L (ref 19–32)
Calcium: 8.9 mg/dL (ref 8.4–10.5)
Chloride: 107 mmol/L (ref 96–112)
Creatinine, Ser: 0.73 mg/dL (ref 0.50–1.10)
GFR calc Af Amer: >90 mL/min (ref >90)
GFR calc non Af Amer: >90 mL/min (ref >90)
Glucose, Bld: 94 mg/dL (ref 70–99)
Potassium: 3.4 mmol/L — ABNORMAL LOW (ref 3.5–5.1)
Sodium: 138 mmol/L (ref 135–145)
Total Bilirubin: 0.5 mg/dL (ref 0.3–1.2)
Total Protein: 6.3 g/dL (ref 6.0–8.3)

## 2014-12-12 LAB — ETHANOL: Alcohol, Ethyl (B): <5 mg/dL (ref 0–9)

## 2014-12-12 LAB — POC OCCULT BLOOD, ED: Fecal Occult Bld: NEGATIVE

## 2014-12-12 LAB — POC URINE PREG, ED: Preg Test, Ur: NEGATIVE

## 2014-12-12 MED ORDER — GI COCKTAIL ~~LOC~~
30.0000 mL | Freq: Once | ORAL | Status: AC
  Administered 2014-12-12: 30 mL via ORAL
  Filled 2014-12-12: qty 30

## 2014-12-12 MED ORDER — ONDANSETRON HCL 4 MG/2ML IJ SOLN
4.0000 mg | Freq: Once | INTRAMUSCULAR | Status: AC
  Administered 2014-12-12: 4 mg via INTRAVENOUS
  Filled 2014-12-12: qty 2

## 2014-12-12 MED ORDER — SODIUM CHLORIDE 0.9 % IV BOLUS (SEPSIS)
1000.0000 mL | Freq: Once | INTRAVENOUS | Status: AC
  Administered 2014-12-12: 1000 mL via INTRAVENOUS

## 2014-12-12 NOTE — Progress Notes (Addendum)
Patient's nurse received the OPT resources and the No Harm Contract.  [place] Abtsatia Arlynn Mcdermid, LCSWA Disposition staff 12/12/2014 8:57 PM

## 2014-12-12 NOTE — ED Notes (Signed)
Pt able to keep fluid and crackers down

## 2014-12-12 NOTE — ED Notes (Signed)
Per EMS, pt presents complaining of chronic abd pain x 2 months. Pt was seen by GI doctor today. Pt also reports darkening stools and yellow emesis. NAD at this time. Pt alert x4.

## 2014-12-12 NOTE — ED Provider Notes (Signed)
CSN: [id number]     Arrival date & time 12/12/14  1810 History   First MD Initiated Contact with Patient 12/12/14 1816     Chief Complaint  Patient presents with  . Abdominal Pain  . Suicidal     (Consider location/radiation/quality/duration/timing/severity/associated sxs/prior Treatment) HPI Pt is a 39yo female with hx of Hep C w/o coma, lumbar back pain, ulcer, and chronic abdominal pain that started 2 months ago, presenting to ED with c/o persistent nausea and vomiting x2.  Pt reports darkened stools and yellow emesis.  Pt states she is "tired of all this" and admits to having SI but denies specific plan.  States she has been given nausea medication by GI who she saw yesterday but states the medication is not helping. Reports having a UTI last month but pt states that was treated and she is still having symptoms of diffuse abdominal pain, nausea and vomiting.  Pt is scheduled for an MRI on Monday, 12/16/14, and states her GI doctor plans to go an endoscopy pending the results of the MRI.  Pt states she cannot take the nausea anymore and did not want to wait until Monday. Denies fever, chills, urinary or vaginal symptoms. Abdominal surgical hx significant for cholecystectomy, ERCP in 2012 and tubal ligation.  No sick contacts or recent travel.    Past Medical History  Diagnosis Date  . Ulcer   . Hep C w/o coma, chronic   . Lumbar back pain    Past Surgical History  Procedure Laterality Date  . Tubal ligation    . Ercp  07/02/2011    Procedure: ENDOSCOPIC RETROGRADE CHOLANGIOPANCREATOGRAPHY (ERCP);  Surgeon: Iva Boop, MD;  Location: [facility] OR;  Service: Gastroenterology;  Laterality: N/A;  . Cholecystectomy  07/03/2011    Procedure: LAPAROSCOPIC CHOLECYSTECTOMY;  Surgeon: Jetty Duhamel, MD;  Location: [facility] OR;  Service: General;  Laterality: N/A;   Family History  Problem Relation Age of Onset  . Cancer Mother   . Diabetes Father   . Heart failure Father   . Hypertension Father    . Stroke Father    History  Substance Use Topics  . Smoking status: Current Every Day Smoker -- 1.00 packs/day    Types: Cigarettes  . Smokeless tobacco: Not on file  . Alcohol Use: No   OB History    No data available     Review of Systems  Constitutional: Positive for appetite change. Negative for fever, chills, diaphoresis and fatigue.  Respiratory: Negative for cough and shortness of breath.   Gastrointestinal: Positive for nausea, vomiting and abdominal pain. Negative for diarrhea, constipation, blood in stool and rectal pain.  Genitourinary: Negative for dysuria, urgency, frequency, hematuria, flank pain, decreased urine volume, vaginal bleeding, vaginal discharge, vaginal pain, menstrual problem and pelvic pain.  Musculoskeletal: Negative for myalgias and back pain.  Psychiatric/Behavioral: Positive for suicidal ideas. Negative for behavioral problems and self-injury. The patient is not nervous/anxious.   All other systems reviewed and are negative.     Allergies  Hydrocodone and Amoxicillin  Home Medications   Prior to Admission medications   Medication Sig Start Date End Date Taking? Authorizing Provider  albuterol (PROVENTIL HFA;VENTOLIN HFA) 108 (90 BASE) MCG/ACT inhaler Inhale 2 puffs into the lungs every 6 (six) hours as needed for wheezing or shortness of breath.   Yes Historical Provider, MD  clonazePAM (KLONOPIN) 0.5 MG tablet Take 0.5 mg by mouth 2 (two) times daily.  10/23/14  Yes Historical Provider, MD  FLUoxetine (PROZAC) 40 MG capsule Take 40 mg by mouth daily.   Yes Historical Provider, MD  gabapentin (NEURONTIN) 800 MG tablet Take 1 tablet by mouth 4 (four) times daily.  10/23/14  Yes Historical Provider, MD  omeprazole (PRILOSEC) 40 MG capsule Take 40 mg by mouth 2 (two) times daily. 12/11/14  Yes Historical Provider, MD  ondansetron (ZOFRAN-ODT) 4 MG disintegrating tablet Take 4 mg by mouth as needed. FOR NAUSEA/VOMITING 12/11/14  Yes Historical Provider, MD   cephALEXin (KEFLEX) 500 MG capsule Take 1 capsule (500 mg total) by mouth 2 (two) times daily. Patient not taking: Reported on 11/19/2014 11/05/14   Eyvonne Mechanic, PA-C  clindamycin (CLEOCIN) 150 MG capsule Take 1 capsule (150 mg total) by mouth every 6 (six) hours. Patient not taking: Reported on 11/19/2014 05/25/14   Marlon Pel, PA-C  metroNIDAZOLE (FLAGYL) 500 MG tablet Take 1 tablet (500 mg total) by mouth 2 (two) times daily. Patient not taking: Reported on 11/19/2014 11/05/14   Eyvonne Mechanic, PA-C  naproxen (NAPROSYN) 375 MG tablet Take 1 tablet (375 mg total) by mouth 2 (two) times daily. Patient not taking: Reported on 11/19/2014 11/10/14   Linwood Dibbles, MD  oxyCODONE-acetaminophen (PERCOCET/ROXICET) 5-325 MG per tablet Take 1-2 tablets by mouth every 6 (six) hours as needed. Patient not taking: Reported on 11/19/2014 05/25/14   Marlon Pel, PA-C  promethazine (PHENERGAN) 25 MG suppository Place 1 suppository (25 mg total) rectally every 8 (eight) hours as needed for nausea or vomiting. 11/19/14   Ladona Mow, PA-C  sulfamethoxazole-trimethoprim (BACTRIM DS,SEPTRA DS) 800-160 MG per tablet Take 1 tablet by mouth 2 (two) times daily. 11/10/14   Linwood Dibbles, MD   BP 121/82 mmHg  Pulse 71  Temp(Src) 97.8 F (36.6 C) (Oral)  Resp 16  SpO2 100%  LMP 12/05/2014 Physical Exam  Constitutional: She appears well-developed and well-nourished. No distress.  HENT:  Head: Normocephalic and atraumatic.  Eyes: Conjunctivae are normal. No scleral icterus.  Neck: Normal range of motion.  Cardiovascular: Normal rate, regular rhythm and normal heart sounds.   Pulmonary/Chest: Effort normal and breath sounds normal. No respiratory distress. She has no wheezes. She has no rales. She exhibits no tenderness.  Abdominal: Soft. Bowel sounds are normal. She exhibits no distension and no mass. There is tenderness ( epigastric). There is no rebound and no guarding.  Musculoskeletal: Normal range of motion.   Neurological: She is alert.  Skin: Skin is warm and dry. She is not diaphoretic.  Psychiatric: Her speech is normal and behavior is normal. She is not agitated and not actively hallucinating. She exhibits a depressed mood. She expresses suicidal ideation. She expresses no homicidal ideation. She expresses no suicidal plans and no homicidal plans.  Nursing note and vitals reviewed.   ED Course  Procedures (including critical care time) Labs Review Labs Reviewed  COMPREHENSIVE METABOLIC PANEL - Abnormal; Notable for the following:    Potassium 3.4 (*)    All other components within normal limits  CBC WITH DIFFERENTIAL/PLATELET - Abnormal; Notable for the following:    WBC 11.6 (*)    Neutro Abs 8.0 (*)    All other components within normal limits  URINALYSIS, ROUTINE W REFLEX MICROSCOPIC - Abnormal; Notable for the following:    APPearance CLOUDY (*)    Hgb urine dipstick MODERATE (*)    Ketones, ur 40 (*)    All other components within normal limits  URINE RAPID DRUG SCREEN (HOSP PERFORMED) - Abnormal; Notable for the following:  Opiates POSITIVE (*)    Cocaine POSITIVE (*)    Tetrahydrocannabinol POSITIVE (*)    All other components within normal limits  URINE MICROSCOPIC-ADD ON - Abnormal; Notable for the following:    Squamous Epithelial / LPF FEW (*)    Bacteria, UA FEW (*)    All other components within normal limits  LIPASE, BLOOD  ETHANOL  POC OCCULT BLOOD, ED  POC URINE PREG, ED    Imaging Review No results found.   EKG Interpretation None      MDM   Final diagnoses:  Chronic abdominal pain  Cocaine use  Opiate use  Marijuana use  Depression    Pt is a 39yo female with 2 month long hx of chronic abdominal pain with associated nausea and vomiting. Pt was seen by GI yesterday, scheduled for an MRI on Monday, 12/16/14.  Pt does have tenderness to epigastrium, however, labs: unremarkable.  Not concerned for surgical abdomen at this time, including but not  limited to SBO, bowel perforation, mesenteric ischemia, appendicitis, TOA, or ovarian torsion.   Pt mentioned during triage she had had thoughts of killing herself due to her chronic symptoms but denies any plan.  Pt evaluated by TTS who agrees pt is safe for outpatient behavioral health treatment as pt agreeable to sign contract for safety.  Resources for pt provided by TTS.   Urine drug screen significant for opiates, cocaine, and THC.  Pt denied using drugs.  Discussed with pt use of drugs, specifically THC can cause cyclic vomiting syndrome. Advised pt to discontinue use to see if this helps with her symptoms. Also encouraged pt to f/u on Monday, 12/16/14 for her MRI as previously scheduled by GI.  Pt already has suppository phenergan as well as ODT zofran.  Advised to f/u with GI and PCP for continued management of chronic abdominal pain and nausea.  Return precautions provided. Pt verbalized understanding and agreement with tx plan.          Junius Finnerrin O'Malley, PA-C 12/12/14 2350  Blake DivineJohn Wofford, MD 12/14/14 [phone]

## 2014-12-12 NOTE — ED Notes (Signed)
Pt reports that she feels depressed due to her health and wants to hurt herself. Pt in paper scrubs, security wanded the pt.

## 2014-12-12 NOTE — BH Assessment (Signed)
Pt presenting to ED with abdominal pain, with depression and SI. Labs pending.  Requested cart be placed with pt for assessment.   Assessment to commence shortly.    Clista BernhardtNancy Lynise Porr, [facility] Triage Specialist 12/12/2014 7:16 PM

## 2014-12-12 NOTE — ED Notes (Signed)
Pt signed no self harm contract
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
## 2014-12-12 NOTE — ED Notes (Signed)
Pt verbalized understanding of d/c instructions and has no further questions. Pt being discharged home with family driving.

## 2014-12-12 NOTE — Discharge Instructions (Signed)

## 2014-12-12 NOTE — ED Notes (Signed)
[facility] they are faxing over resources for the patient to use after d/c

## 2014-12-16 ENCOUNTER — Ambulatory Visit
Admission: RE | Admit: 2014-12-16 | Discharge: 2014-12-16 | Disposition: A | Discharge status: Home / Self Care | Payer: Medicaid Other | Source: Ambulatory Visit | Attending: Physician Assistant | Admitting: Physician Assistant

## 2014-12-16 DIAGNOSIS — R1013 Epigastric pain: Secondary | ICD-10-CM

## 2015-02-26 ENCOUNTER — Emergency Department (HOSPITAL_COMMUNITY)
Admission: EM | Admit: 2015-02-26 | Discharge: 2015-02-26 | Disposition: A | Discharge status: Home / Self Care | Payer: Medicaid Other | Attending: Emergency Medicine | Admitting: Emergency Medicine

## 2015-02-26 ENCOUNTER — Encounter (HOSPITAL_COMMUNITY): Payer: Self-pay | Admitting: Neurology

## 2015-02-26 ENCOUNTER — Emergency Department (HOSPITAL_COMMUNITY): Payer: Medicaid Other

## 2015-02-26 DIAGNOSIS — Z872 Personal history of diseases of the skin and subcutaneous tissue: Secondary | ICD-10-CM | POA: Insufficient documentation

## 2015-02-26 DIAGNOSIS — Z720 Tobacco use: Secondary | ICD-10-CM | POA: Diagnosis not present

## 2015-02-26 DIAGNOSIS — R1013 Epigastric pain: Secondary | ICD-10-CM | POA: Diagnosis not present

## 2015-02-26 DIAGNOSIS — R1011 Right upper quadrant pain: Secondary | ICD-10-CM | POA: Insufficient documentation

## 2015-02-26 DIAGNOSIS — Z79899 Other long term (current) drug therapy: Secondary | ICD-10-CM | POA: Diagnosis not present

## 2015-02-26 DIAGNOSIS — Z8619 Personal history of other infectious and parasitic diseases: Secondary | ICD-10-CM | POA: Diagnosis not present

## 2015-02-26 DIAGNOSIS — R52 Pain, unspecified: Secondary | ICD-10-CM

## 2015-02-26 DIAGNOSIS — Z880 Allergy status to penicillin: Secondary | ICD-10-CM | POA: Diagnosis not present

## 2015-02-26 DIAGNOSIS — Z9049 Acquired absence of other specified parts of digestive tract: Secondary | ICD-10-CM | POA: Insufficient documentation

## 2015-02-26 LAB — I-STAT BETA HCG BLOOD, ED (MC, WL, AP ONLY): I-stat hCG, quantitative: <5 m[IU]/mL (ref <5)

## 2015-02-26 LAB — COMPREHENSIVE METABOLIC PANEL
ALK PHOS: 63 U/L (ref 38–126)
ALT: 17 U/L (ref 14–54)
ANION GAP: 8 (ref 5–15)
AST: 14 U/L — ABNORMAL LOW (ref 15–41)
Albumin: 3.9 g/dL (ref 3.5–5.0)
BILIRUBIN TOTAL: 0.3 mg/dL (ref 0.3–1.2)
BUN: 11 mg/dL (ref 6–20)
CHLORIDE: 109 mmol/L (ref 101–111)
CO2: 22 mmol/L (ref 22–32)
CREATININE: 0.91 mg/dL (ref 0.44–1.00)
Calcium: 8.7 mg/dL — ABNORMAL LOW (ref 8.9–10.3)
GFR calc non Af Amer: >60 mL/min (ref >60)
Glucose, Bld: 101 mg/dL — ABNORMAL HIGH (ref 65–99)
Potassium: 3.6 mmol/L (ref 3.5–5.1)
Sodium: 139 mmol/L (ref 135–145)
Total Protein: 6.4 g/dL — ABNORMAL LOW (ref 6.5–8.1)

## 2015-02-26 LAB — I-STAT TROPONIN, ED: Troponin i, poc: 0 ng/mL (ref 0.00–0.08)

## 2015-02-26 LAB — URINALYSIS, ROUTINE W REFLEX MICROSCOPIC
BILIRUBIN URINE: NEGATIVE
Glucose, UA: NEGATIVE mg/dL
HGB URINE DIPSTICK: NEGATIVE
Ketones, ur: 15 mg/dL — AB
Leukocytes, UA: NEGATIVE
NITRITE: NEGATIVE
PROTEIN: NEGATIVE mg/dL
Specific Gravity, Urine: 1.014 (ref 1.005–1.030)
Urobilinogen, UA: 0.2 mg/dL (ref 0.0–1.0)
pH: 7 (ref 5.0–8.0)

## 2015-02-26 LAB — CBC WITH DIFFERENTIAL/PLATELET
BASOS ABS: 0 10*3/uL (ref 0.0–0.1)
Basophils Relative: 0 % (ref 0–1)
Eosinophils Absolute: 0.1 10*3/uL (ref 0.0–0.7)
Eosinophils Relative: 0 % (ref 0–5)
HCT: 40.4 % (ref 36.0–46.0)
Hemoglobin: 13.4 g/dL (ref 12.0–15.0)
LYMPHS PCT: 24 % (ref 12–46)
Lymphs Abs: 2.8 10*3/uL (ref 0.7–4.0)
MCH: 30.1 pg (ref 26.0–34.0)
MCHC: 33.2 g/dL (ref 30.0–36.0)
MCV: 90.8 fL (ref 78.0–100.0)
MONO ABS: 0.5 10*3/uL (ref 0.1–1.0)
MONOS PCT: 4 % (ref 3–12)
Neutro Abs: 8.5 10*3/uL — ABNORMAL HIGH (ref 1.7–7.7)
Neutrophils Relative %: 72 % (ref 43–77)
Platelets: 291 10*3/uL (ref 150–400)
RBC: 4.45 MIL/uL (ref 3.87–5.11)
RDW: 14.6 % (ref 11.5–15.5)
WBC: 11.8 10*3/uL — AB (ref 4.0–10.5)

## 2015-02-26 LAB — LIPASE, BLOOD: LIPASE: 28 U/L (ref 22–51)

## 2015-02-26 MED ORDER — FENTANYL CITRATE (PF) 100 MCG/2ML IJ SOLN
50.0000 ug | Freq: Once | INTRAMUSCULAR | Status: AC
  Administered 2015-02-26: 50 ug via INTRAVENOUS
  Filled 2015-02-26: qty 2

## 2015-02-26 MED ORDER — DICYCLOMINE HCL 20 MG PO TABS
20.0000 mg | ORAL_TABLET | Freq: Two times a day (BID) | ORAL | Status: DC
Start: 2015-02-26 — End: 2015-08-19

## 2015-02-26 MED ORDER — SODIUM CHLORIDE 0.9 % IV BOLUS (SEPSIS)
1000.0000 mL | Freq: Once | INTRAVENOUS | Status: AC
  Administered 2015-02-26: 1000 mL via INTRAVENOUS

## 2015-02-26 MED ORDER — FAMOTIDINE 20 MG PO TABS: 20.0000 mg | ORAL_TABLET | Freq: Two times a day (BID) | ORAL | Status: DC

## 2015-02-26 MED ORDER — ONDANSETRON HCL 4 MG/2ML IJ SOLN
4.0000 mg | Freq: Once | INTRAMUSCULAR | Status: AC
  Administered 2015-02-26: 4 mg via INTRAVENOUS
  Filled 2015-02-26: qty 2

## 2015-02-26 NOTE — ED Provider Notes (Signed)
CSN: [id number]     Arrival date & time 02/26/15  4098 History   First MD Initiated Contact with Patient 02/26/15 [phone]     Chief Complaint  Patient presents with  . Abdominal Pain     (Consider location/radiation/quality/duration/timing/severity/associated sxs/prior Treatment) HPI   Blood pressure 115/84, pulse 77, temperature 97.9 F (36.6 C), temperature source Oral, resp. rate 16, SpO2 98 %.  Cathy Santos is a 39 y.o. female complaining of 9 out of 10 epigastric abdominal pain associated with nausea onset yesterday. Patient also thinks that she her abdomen is swelling worsening over the course of several months. Patient has chronic hepatitis C, she follows with gastroenterology, is not on any treatment at this time. She denies emesis, jaundice, change in bowel or bladder habits. She taking no pain medication prior to arrival, no exacerbating or alleviating symptoms identified and patient cannot describe the quality of the pain. She denies chest pain or shortness of breath. Patient is active daily smoker, she denies history of diabetes, hypertension, hyperlipidemia however she has a family history of CAD. She is tearful which she states is secondary to pain, has history of extensive psychiatric issues. She denies suicidal ideation, homicidal ideation, auditory or visual hallucinations alcohol abuse.  Past Medical History  Diagnosis Date  . Ulcer   . Hep C w/o coma, chronic   . Lumbar back pain    Past Surgical History  Procedure Laterality Date  . Tubal ligation    . Ercp  07/02/2011    Procedure: ENDOSCOPIC RETROGRADE CHOLANGIOPANCREATOGRAPHY (ERCP);  Surgeon: Iva Boop, MD;  [facility];  Service: Gastroenterology;  Laterality: N/A;  . Cholecystectomy  07/03/2011    Procedure: LAPAROSCOPIC CHOLECYSTECTOMY;  Surgeon: Jetty Duhamel, MD;  Location: [facility] OR;  Service: General;  Laterality: N/A;   Family History  Problem Relation Age of Onset  . Cancer Mother   .  Diabetes Father   . Heart failure Father   . Hypertension Father   . Stroke Father    History  Substance Use Topics  . Smoking status: Current Every Day Smoker -- 1.00 packs/day    Types: Cigarettes  . Smokeless tobacco: Not on file  . Alcohol Use: No   OB History    No data available     Review of Systems  10 systems reviewed and found to be negative, except as noted in the HPI.   Allergies  Hydrocodone and Amoxicillin  Home Medications   Prior to Admission medications   Medication Sig Start Date End Date Taking? Authorizing Provider  albuterol (PROVENTIL HFA;VENTOLIN HFA) 108 (90 BASE) MCG/ACT inhaler Inhale 2 puffs into the lungs every 6 (six) hours as needed for wheezing or shortness of breath.    Historical Provider, MD  cephALEXin (KEFLEX) 500 MG capsule Take 1 capsule (500 mg total) by mouth 2 (two) times daily. Patient not taking: Reported on 11/19/2014 11/05/14   Eyvonne Mechanic, PA-C  clindamycin (CLEOCIN) 150 MG capsule Take 1 capsule (150 mg total) by mouth every 6 (six) hours. Patient not taking: Reported on 11/19/2014 05/25/14   Marlon Pel, PA-C  clonazePAM (KLONOPIN) 0.5 MG tablet Take 0.5 mg by mouth 2 (two) times daily.  10/23/14   Historical Provider, MD  dicyclomine (BENTYL) 20 MG tablet Take 1 tablet (20 mg total) by mouth 2 (two) times daily. 02/26/15   Michelle Vanhise, PA-C  famotidine (PEPCID) 20 MG tablet Take 1 tablet (20 mg total) by mouth 2 (two) times daily. 02/26/15  Square Jowett, PA-C  FLUoxetine (PROZAC) 40 MG capsule Take 40 mg by mouth daily.    Historical Provider, MD  gabapentin (NEURONTIN) 800 MG tablet Take 1 tablet by mouth 4 (four) times daily.  10/23/14   Historical Provider, MD  metroNIDAZOLE (FLAGYL) 500 MG tablet Take 1 tablet (500 mg total) by mouth 2 (two) times daily. Patient not taking: Reported on 11/19/2014 11/05/14   Eyvonne Mechanic, PA-C  naproxen (NAPROSYN) 375 MG tablet Take 1 tablet (375 mg total) by mouth 2 (two) times  daily. Patient not taking: Reported on 11/19/2014 11/10/14   Linwood Dibbles, MD  omeprazole (PRILOSEC) 40 MG capsule Take 40 mg by mouth 2 (two) times daily. 12/11/14   Historical Provider, MD  ondansetron (ZOFRAN-ODT) 4 MG disintegrating tablet Take 4 mg by mouth as needed. FOR NAUSEA/VOMITING 12/11/14   Historical Provider, MD  oxyCODONE-acetaminophen (PERCOCET/ROXICET) 5-325 MG per tablet Take 1-2 tablets by mouth every 6 (six) hours as needed. Patient not taking: Reported on 11/19/2014 05/25/14   Marlon Pel, PA-C  promethazine (PHENERGAN) 25 MG suppository Place 1 suppository (25 mg total) rectally every 8 (eight) hours as needed for nausea or vomiting. 11/19/14   Ladona Mow, PA-C  sulfamethoxazole-trimethoprim (BACTRIM DS,SEPTRA DS) 800-160 MG per tablet Take 1 tablet by mouth 2 (two) times daily. 11/10/14   Linwood Dibbles, MD   BP 119/81 mmHg  Pulse 74  Temp(Src) 97.9 F (36.6 C) (Oral)  Resp 18  SpO2 99%  LMP  (LMP Unknown) Physical Exam  Constitutional: She is oriented to person, place, and time. She appears well-developed and well-nourished. No distress.  HENT:  Head: Normocephalic.  Eyes: Conjunctivae and EOM are normal.  Cardiovascular: Normal rate, regular rhythm and intact distal pulses.   Pulmonary/Chest: Effort normal and breath sounds normal. No stridor. No respiratory distress. She has no wheezes. She has no rales. She exhibits no tenderness.  Abdominal: Soft. She exhibits no distension. There is tenderness.  Normoactive bowel sounds, tender to palpation in the epigastrium with no guarding or rebound.  Mild focal tenderness in the right upper quadrant  Musculoskeletal: Normal range of motion.  Neurological: She is alert and oriented to person, place, and time.  Psychiatric: She has a normal mood and affect.  Tearful  Nursing note and vitals reviewed.   ED Course  Procedures (including critical care time) Labs Review Labs Reviewed  CBC WITH DIFFERENTIAL/PLATELET - Abnormal;  Notable for the following:    WBC 11.8 (*)    Neutro Abs 8.5 (*)    All other components within normal limits  COMPREHENSIVE METABOLIC PANEL - Abnormal; Notable for the following:    Glucose, Bld 101 (*)    Calcium 8.7 (*)    Total Protein 6.4 (*)    AST 14 (*)    All other components within normal limits  URINALYSIS, ROUTINE W REFLEX MICROSCOPIC (NOT AT [facility]) - Abnormal; Notable for the following:    Ketones, ur 15 (*)    All other components within normal limits  LIPASE, BLOOD  I-STAT BETA HCG BLOOD, ED ([facility], [facility], [facility] ONLY)  I-STAT TROPOININ, ED    Imaging Review Dg Abd Acute W/chest  02/26/2015   CLINICAL DATA:  Two day history of right-sided abdominal pain and nausea  EXAM: DG ABDOMEN ACUTE W/ 1V CHEST  COMPARISON:  CT abdomen and pelvis June 30, 2011  FINDINGS: PA chest: Lungs are clear. Heart size and pulmonary vascularity are normal. No adenopathy.  Supine and upright abdomen: There is moderate stool  in the colon. There is no bowel dilatation or air-fluid level suggesting obstruction. No free air. There are surgical clips in the right upper quadrant.  IMPRESSION: Bowel gas pattern unremarkable. No obstruction or air. Lungs clear.   Electronically Signed   By: Bretta BangWilliam  Woodruff III M.D.   On: 02/26/2015 09:14   [place] Abdomen Limited Ruq  02/26/2015   CLINICAL DATA:  Chronic abdominal pain and epigastric swelling, history of hepatitis seen increased abdominal discomfort yesterday, history of previous cholecystectomy.  EXAM: US ABDOMEN LIMITED - RIGHT UPPER QUADRANT  COMPARISON:  Abdominal ultrasound and abdominal CT scan of June 30, 2011.  FINDINGS: Gallbladder:  The gallbladder is surgically absent. There are no abnormal fluid collections in the gallbladder fossa.  Common bile duct:  Diameter: 4.8 mm  Liver:  The liver exhibits normal echotexture. There is no focal mass or ductal dilation. The surface contour is normal.  IMPRESSION: The gallbladder is surgically absent. The liver and  common bile duct are within the limits of normal.   Electronically Signed   By: David  SwazilandJordan M.D.   On: 02/26/2015 09:18     EKG Interpretation   Date/Time:  Wednesday February 26 2015 08:37:57 EDT Ventricular Rate:  78 PR Interval:  134 QRS Duration: 106 QT Interval:  391 QTC Calculation: 445 R Axis:   4 Text Interpretation:  Sinus rhythm Confirmed by HARRISON  MD, FORREST  ([id number]) on 02/26/2015 8:53:51 AM      MDM   Final diagnoses:  Pain  Pain    Filed Vitals:   02/26/15 0830 02/26/15 0930 02/26/15 1000  BP: 115/84 116/81 119/81  Pulse: 77 68 74  Temp: 97.9 F (36.6 C)    TempSrc: Oral    Resp: 16 11 18   SpO2: 98% 100% 99%    Medications  fentaNYL (SUBLIMAZE) injection 50 mcg (50 mcg Intravenous Given 02/26/15 0851)  sodium chloride 0.9 % bolus 1,000 mL (0 mLs Intravenous Stopped 02/26/15 0942)  ondansetron (ZOFRAN) injection 4 mg (4 mg Intravenous Given 02/26/15 0850)    Sherle PoeBarbara E Orsak is a pleasant 39 y.o. female presenting with severe epigastric abdominal pain. It appears this is chronic in nature, chart review shows that patient has had several visits for similar pain, she states that she has hepatitis C for greater than a decade, and is untreated. Abdominal exam nonsurgical.   Chart review shows that she has followed with [facility] will times in the past. States that she has no pain medication at home. Patient will be given fentanyl in the ED, will check basic blood work, cardiac workup secondary to her risk factors and the location of the pain. She also has history of cocaine abuse. DG shows normal sinus rhythm, no ischemic changes. Troponin is negative. Lungs clear on acute abdominal series.  Blood work with a very mild leukocytosis of 11.8. No transaminitis. Ultrasound of abdomen shows a normal liver and common bile ducts. Acute abdominal x-ray negative. She will be given Bentyl, Pepcid, I will encourage her to follow closely with gastroenterology. Repeat abdominal  exam is benign.  Evaluation does not show pathology that would require ongoing emergent intervention or inpatient treatment. Pt is hemodynamically stable and mentating appropriately. Discussed findings and plan with patient/guardian, who agrees with care plan. All questions answered. Return precautions discussed and outpatient follow up given.   New Prescriptions   DICYCLOMINE (BENTYL) 20 MG TABLET    Take 1 tablet (20 mg total) by mouth 2 (two) times daily.  FAMOTIDINE (PEPCID) 20 MG TABLET    Take 1 tablet (20 mg total) by mouth 2 (two) times daily.         Wynetta Emery, PA-C 02/26/15 1029  Purvis Sheffield, MD 02/27/15 [phone]

## 2015-02-26 NOTE — Discharge Instructions (Signed)
Please follow closely with your gastroenterologist.  Do not hesitate to return to the emergency room for any new, worsening or concerning symptoms.

## 2015-02-26 NOTE — ED Notes (Signed)
Pt reports liver problems and last night had epigastric pain that was severe, has been having swelling to her abdomen for several months. Is waiting to see liver doctor but doesn't have appt for some time. Denies vomiting.

## 2015-02-26 NOTE — ED Notes (Signed)
Patient transported to Ultrasound 

## 2015-03-06 ENCOUNTER — Other Ambulatory Visit (HOSPITAL_COMMUNITY): Payer: Self-pay | Admitting: Gastroenterology

## 2015-03-06 DIAGNOSIS — B182 Chronic viral hepatitis C: Secondary | ICD-10-CM

## 2015-04-09 ENCOUNTER — Ambulatory Visit (HOSPITAL_COMMUNITY): Payer: Medicaid Other

## 2015-08-19 ENCOUNTER — Encounter (HOSPITAL_COMMUNITY): Payer: Self-pay | Admitting: Emergency Medicine

## 2015-08-19 ENCOUNTER — Emergency Department (HOSPITAL_COMMUNITY)
Admission: EM | Admit: 2015-08-19 | Discharge: 2015-08-19 | Disposition: A | Discharge status: Home / Self Care | Payer: Medicaid Other | Attending: Emergency Medicine | Admitting: Emergency Medicine

## 2015-08-19 DIAGNOSIS — K029 Dental caries, unspecified: Secondary | ICD-10-CM

## 2015-08-19 DIAGNOSIS — K0889 Other specified disorders of teeth and supporting structures: Secondary | ICD-10-CM | POA: Diagnosis not present

## 2015-08-19 DIAGNOSIS — Z79899 Other long term (current) drug therapy: Secondary | ICD-10-CM | POA: Insufficient documentation

## 2015-08-19 DIAGNOSIS — Z8619 Personal history of other infectious and parasitic diseases: Secondary | ICD-10-CM | POA: Diagnosis not present

## 2015-08-19 DIAGNOSIS — F17210 Nicotine dependence, cigarettes, uncomplicated: Secondary | ICD-10-CM | POA: Insufficient documentation

## 2015-08-19 DIAGNOSIS — Z880 Allergy status to penicillin: Secondary | ICD-10-CM | POA: Diagnosis not present

## 2015-08-19 MED ORDER — IBUPROFEN 800 MG PO TABS: 800.0000 mg | ORAL_TABLET | Freq: Three times a day (TID) | ORAL | Status: DC

## 2015-08-19 MED ORDER — LIDOCAINE VISCOUS 2 % MT SOLN
15.0000 mL | Freq: Once | OROMUCOSAL | Status: AC
Start: 2015-08-19 — End: 2015-08-19
  Administered 2015-08-19: 15 mL via OROMUCOSAL
  Filled 2015-08-19: qty 15

## 2015-08-19 MED ORDER — LIDOCAINE VISCOUS 2 % MT SOLN: 20.0000 mL | OROMUCOSAL | Status: DC | PRN

## 2015-08-19 MED ORDER — KETOROLAC TROMETHAMINE 60 MG/2ML IM SOLN
60.0000 mg | Freq: Once | INTRAMUSCULAR | Status: AC
  Administered 2015-08-19: 60 mg via INTRAMUSCULAR
  Filled 2015-08-19: qty 2

## 2015-08-19 NOTE — ED Notes (Signed)
See PA assessment 

## 2015-08-19 NOTE — ED Notes (Signed)
Patient here for dental pain.  Patient states she has teeth that are broken and decayed.   Patient denies other symptoms.

## 2015-08-19 NOTE — ED Notes (Signed)
Pt. Left with all belongings and refused wheelchair. Discharge instructions were reviewed and all questions were answered.  

## 2015-08-19 NOTE — Discharge Instructions (Signed)
You have been seen today for dental pain. The only treatment for this issue is to be evaluated by a dentist. You must follow up with a dentist on this issue. Follow up with PCP as needed. Return to ED should symptoms worsen.    Emergency Department Resource Guide 1) Find a Doctor and Pay Out of Pocket Although you won't have to find out who is covered by your insurance plan, it is a good idea to ask around and get recommendations. You will then need to call the office and see if the doctor you have chosen will accept you as a new patient and what types of options they offer for patients who are self-pay. Some doctors offer discounts or will set up payment plans for their patients who do not have insurance, but you will need to ask so you aren't surprised when you get to your appointment.  2) Contact Your Local Health Department Not all health departments have doctors that can see patients for sick visits, but many do, so it is worth a call to see if yours does. If you don't know where your local health department is, you can check in your phone book. The CDC also has a tool to help you locate your state's health department, and many state websites also have listings of all of their local health departments.  3) Find a Walk-in Clinic If your illness is not likely to be very severe or complicated, you may want to try a walk in clinic. These are popping up all over the country in pharmacies, drugstores, and shopping centers. They're usually staffed by nurse practitioners or physician assistants that have been trained to treat common illnesses and complaints. They're usually fairly quick and inexpensive. However, if you have serious medical issues or chronic medical problems, these are probably not your best option.  No Primary Care Doctor: - Call Health Connect at  [phone] - they can help you locate a primary care doctor that  accepts your insurance, provides certain services, etc. - Physician Referral  Service- [phone]  Chronic Pain Problems: Organization         Address  Phone   Notes  [facility]  [phone] Patients need to be referred by their primary care doctor.   Medication Assistance: Organization         Address  Phone   Notes  [facility] [address] [place], [place] [address] [phone]) [phone] --Must be a resident of [facility] [place] -- Must have NO insurance coverage whatsoever (no Medicaid/ Medicare, etc.) -- The pt. MUST have a primary care doctor that directs their care regularly and follows them in the community   Med[facility]ist  [phone]   [employer]  [phone]    Agencies that provide inexpensive medical care: Organization         Address  Phone   Notes  [facility]  [phone]   [facility]    [phone]) [phone]   [facility] [address] [place], [place] [address] [phone]   [facility] [place] [address], [place] [phone]) [phone]   [facility]anned Parenthood    [phone](336) [phone]   [facility]    [phone]   [facility]  [address], [place] Phone:  [phone], Fax:  [phone] Hours of Operation:  9 am - 6 pm, M-F.  Also accepts Medicaid/Medicare and self-pay.  [facility] for [address] [address], [address], [place] Phone: [phone], Fax: [phone]. Hours of Operation:  8:30 am - 5:30 pm, M-F.  Also accepts Medicaid and self-pay.  [facility] [place] [address], [place] Phone: [phone]   [place], [place], [place] [phone], Ext. [phone] Mondays & Thursdays: 7-9 AM.  First 15 patients are seen on a first come, first serve basis.    [place] Providers:  Organization         Address  Phone   Notes  [facility] [address], [place] [phone] Also accepts self-pay patients.  [facility] [address], [place]  [phone]   [place], [address], [place] [phone]   [facility] [address] [place] [phone]   Lucianne Lei [address], [place]   [phone] Only accepts [place] Access [place] patients after they have their name applied to their card.   Self-Pay (no insurance) in [facility]:  Organization         Address  Phone   Notes  Sickle Cell Patients, [facility] [place] [phone]   [facility] [place] [phone]   [facility]  [place], [place], [place] [phone]   [facility]/Dr. Osei-Bonsu  [address] [place] or [address], [place] [phone] Phone number for both [place] and [place] locations is the same.  [facility]gent [facility] and [facility] [address] [place] [phone]   [facility] [address] [place] or [address] [phone]   [facility] [address], [place] [phone], phone; [phone], fax Sees patients 1st and 3rd Saturday of every month.  Must not qualify for public or private insurance (i.e. Medicaid, Medicare, [place] Health Choice, Veterans' Benefits)  Household income should be no more than 200% of the poverty level The clinic cannot treat you if you are pregnant or think you are pregnant  Sexually transmitted diseases are not treated at the clinic.    Dental Care: Organization         Address  Phone  Notes  [facility] [place] [phone] Accepts children up to age 47 who are enrolled in  [place] or [place]; pregnant women with a Medicaid card; and children who have applied for Medicaid or [place] Health Choice, but were declined, whose parents can pay a reduced fee at time of service.  [facility] Department of [facility]  [address], [place] [phone] Accepts children up to age 94 who are enrolled in [place] or [place]; pregnant women with a Medicaid card; and children who have applied for Medicaid or [place] Health Choice, but were declined, whose parents can pay a reduced fee at time of service.  [facility]  [place] [phone] Patients are seen by appointment only. Walk-ins are not accepted. [place] will see patients 76 years of age and older. Monday - Tuesday (  8am-5pm) Most Wednesdays (8:30-5pm) $30 per visit, cash only  [facility] Access PROGRAM  [address], [facility] [phone] Patients are seen by appointment only. Walk-ins are not accepted. [place] will see patients 17 years of age and older. One Wednesday Evening (Monthly: Volunteer Based).  $30 per visit, cash only  [place]  [phone] for adults; Children under age 65, call Graduate Pediatric Dentistry at [phone]. Children aged 66-14, please call [phone] to request a pediatric application.  Dental services are provided in all areas of dental care including fillings, crowns and bridges, complete and partial dentures, implants, gum treatment, root canals, and extractions. Preventive care is also provided. Treatment is provided to both adults and children. Patients are selected via a lottery and there is often a waiting list.   [facility] [address], [place]  [phone] [email or web address]   [facility] [address] [place], [place] [phone], Ext. [phone] Second and Fourth Thursday of each month, opens at 6:30  AM; Clinic ends at 9 AM.  Patients are seen on a first-come first-served basis, and a limited number are seen during each clinic.   [facility]  [address] Hillard Danker [place], [place] [phone]   Eligibility Requirements You must have lived in [place], [place], or [place] counties for at least the last three months.   You cannot be eligible for state or federal sponsored [employer], including [employer], [place], or [employer].   You generally cannot be eligible for healthcare insurance through your employer.    How to apply: Eligibility screenings are held every Tuesday and Wednesday afternoon from 1:00 pm until 4:00 pm. You do not need an appointment for the interview!  [facility] [address], [place], [place]   [place]  [place] Department  [place]  [phone]    Behavioral Health Resources in the Community: Intensive Outpatient Programs Organization         Address  Phone  Notes  [place]. [address], [place], [place] [phone]   [facility] Outpatient [address] [place], [place]   ADS: Alcohol & Drug [facility] [address], [place], [place]   [place] [address]  [place], [place] or [phone]   Substance Abuse Resources Organization         Address  Phone  Notes  Alcohol and Drug Services  [phone]   [place]  [phone]   [facility] [place]   Chinita Pester  [phone]   Residential & Outpatient Substance Abuse Program  [phone]   Psychological Services Organization         Address  Phone  Notes  [facility] [place]  [facility]  [phone]   [place] [address] [place] or  [phone]    Mobile Crisis Teams Organization         Address  Phone  Notes  [facility], Mobile Crisis Care Unit  [phone]   [facility]  [address] [place], [place]   [place] Levels [address], [place] [phone]    Self-Help/Support Groups Organization         Address  Phone  Notes  [facility] [place] - variety of support groups  [place] Call for more information  Narcotics Anonymous (NA), [facility] [address], [employer] [place]  2 meetings at this location   Special educational needs teacher         Address  Phone  Notes  [facility] Residential Treatment [place],    [place]  [phone]   [facility]  [address] [place] [phone], [place], [place]   [place], [place] [phone] Admissions: 8am-3pm M-F  Incentives Substance [place] [address]    [place], [place] [phone]   [facility] [address] [place], [place], [place]   [facility] [address],  [place], [place]   [facility] - Intensive Outpatient [address], [place], [place]   [facility] [address]  [place], [place] [phone] or [phone]   [facility] Services (RTS) [address] [place], [place] Accepts Medicaid  [facility] [place] [address],  [place] [phone] Substance Abuse/Addiction Treatment   [facility] Organization         Address  Phone  Notes  [facility]  [phone]   Domenic Schwab, PhD [address] Arlis Porta [place], [place]   [phone] or [phone]   [place] and [place], [place] [phone]   [facility] [address],  [place], [place] [phone] Insurance/Medicaid/sponsorship through [facility] and Families [address], [address] [place]                                    [place], [place] [phone] [place] [address], [place] [phone]    Dr. Adele Schilder  [phone]   [facility] Dept. 1) [address], [place] 2) [place] 3)  [place] [address], [place] [phone]  [phone]   [place] [phone] or [phone] (After Hours)

## 2015-08-19 NOTE — ED Provider Notes (Signed)
CSN: [id number]     Arrival date & time 08/19/15  1451 History  By signing my name below, I, Cathy Santos, attest that this documentation has been prepared under the direction and in the presence of Lyncoln Ledgerwood, PA-C. Electronically Signed: Tanda RockersMargaux Santos, ED Scribe. 08/19/2015. 3:11 PM.   Chief Complaint  Patient presents with  . Dental Pain   The history is provided by the patient. No language interpreter was used.     HPI Comments: Cathy Santos is a 40 y.o. female who presents to the Emergency Department complaining of gradual onset, constant, 8/10, right lower dental pain x 1 week. No trauma to the teeth. Pt has hx of poor dentition with multiple missing and broken teeth. She has been taking Aleve and applying numbing ointment to the area without relief. Pt states she was seen by her PCP for her pain and was told to come to the ED. She has not seen a dentist for her symptoms. Pt has hx of recurrent visits to the ED in the past for dental pain. Denies fever, chills, shortness of breath, difficulty swallowing, or any other associated symptoms. Pt is current everyday smoker who smokes 1 ppd. Denies illicit drug use. 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    Past Medical History  Diagnosis Date  . Ulcer   . Hep C w/o coma, chronic (HCC)   . Lumbar back pain    Past Surgical History  Procedure Laterality Date  . Tubal ligation    . Ercp  07/02/2011    Procedure: ENDOSCOPIC RETROGRADE CHOLANGIOPANCREATOGRAPHY (ERCP);  Surgeon: Iva Booparl E Gessner, MD;  [facility];  Service: Gastroenterology;  Laterality: N/A;  . Cholecystectomy  07/03/2011    Procedure: LAPAROSCOPIC CHOLECYSTECTOMY;  Surgeon: Jetty DuhamelJames O Wyatt III, MD;  Location: [facility] OR;  Service: General;  Laterality: N/A;   Family History  Problem Relation Age of Onset  . Cancer Mother   . Diabetes Father   . Heart failure Father   . Hypertension Father   . Stroke Father    Social History   Substance Use Topics  . Smoking status: Current Every Day Smoker -- 1.00 packs/day    Types: Cigarettes  . Smokeless tobacco: None  . Alcohol Use: No   OB History    No data available     Review of Systems  Constitutional: Negative for fever and chills.  HENT: Positive for dental problem. Negative for trouble swallowing.   Respiratory: Negative for shortness of breath.   All other systems reviewed and are negative.     Allergies  Hydrocodone and Amoxicillin  Home Medications   Prior to Admission medications   Medication Sig Start Date End Date Taking? Authorizing Provider  albuterol (PROVENTIL HFA;VENTOLIN HFA) 108 (90 BASE) MCG/ACT inhaler Inhale 2 puffs into the lungs every 6 (six) hours as needed for wheezing or shortness of breath.   Yes Historical Provider, MD  clonazePAM (KLONOPIN) 0.5 MG tablet Take 0.5 mg by mouth 2 (two) times daily.  10/23/14  Yes Historical Provider, MD  FLUoxetine (PROZAC) 40 MG capsule Take 40 mg by mouth daily.   Yes Historical Provider, MD  gabapentin (NEURONTIN) 800 MG tablet Take 1 tablet by mouth 4 (four) times daily.  10/23/14  Yes Historical Provider, MD  ibuprofen (ADVIL,MOTRIN) 800 MG tablet Take 1 tablet (800 mg total) by mouth 3 (three) times daily. 08/19/15   Shenee Wignall C Ninah Moccio, PA-C  lidocaine (  XYLOCAINE) 2 % solution Use as directed 20 mLs in the mouth or throat as needed for mouth pain. 08/19/15   Solomia Harrell C Excell Neyland, PA-C  ondansetron (ZOFRAN-ODT) 4 MG disintegrating tablet Take 4 mg by mouth as needed. FOR NAUSEA/VOMITING 12/11/14   Historical Provider, MD   Triage Vitals:  BP 123/78 mmHg  Pulse 65  Temp(Src) 98.4 F (36.9 C) (Oral)  Resp 14  SpO2 100%  LMP 07/17/2015   Physical Exam  Constitutional: She appears well-developed and well-nourished. No distress.  HENT:  Head: Normocephalic and atraumatic.  Multiple tooth stubs even with the gumline on the upper and lower jaw Widespread dental caries  3 remaining bottom teeth with dental caries   No area of fluctuance, erythema, or masses No discernable abscess   Eyes: Conjunctivae are normal.  Neck: Neck supple.  No cervical lymphadenopathy  Cardiovascular: Normal rate and regular rhythm.   Pulmonary/Chest: Effort normal and breath sounds normal. No respiratory distress.  Musculoskeletal: Normal range of motion.  Neurological: She is alert.  Skin: Skin is warm and dry.  Psychiatric: She has a normal mood and affect. Her behavior is normal.  Nursing note and vitals reviewed.   ED Course  Procedures (including critical care time)  DIAGNOSTIC STUDIES: Oxygen Saturation is 100% on RA, normal by my interpretation.    COORDINATION OF CARE: 3:11 PM-Discussed treatment plan which includes referral to dentist with pt at bedside and pt agreed to plan.   Labs Review Labs Reviewed - No data to display  Imaging Review No results found.   EKG Interpretation None      MDM   Final diagnoses:  Pain due to dental caries   Cathy Santos presents with dental pain for 1 week.  Patient's pain is most likely due to dental caries. The definitive treatment for this issue is proper dental care and evaluation by a dentist. Patient has been seen multiple times in the ED over the last year for dental pain and dental caries. Patient is on a chronic pain management plan by her PCP. Patient has no area that would benefit from drainage. Patient is nontoxic appearing, afebrile, not tachycardic, swallowing and breathing normally, and maintaining a SPO2 of 100% on room air. It was made clear to the patient that she would need to be evaluated by a dentist as soon as possible for this issue. The patient was given instructions for home care as well as return precautions. Patient voices understanding of these instructions, accepts the plan, and is comfortable with discharge.   I personally performed the services described in this documentation, which was scribed in my presence. The recorded  information has been reviewed and is accurate.      Anselm Pancoast, PA-C 08/19/15 1544  Leta Baptist, MD 08/23/15 [phone]

## 2015-12-01 ENCOUNTER — Other Ambulatory Visit: Payer: Self-pay | Admitting: Internal Medicine

## 2015-12-01 DIAGNOSIS — N63 Unspecified lump in unspecified breast: Secondary | ICD-10-CM

## 2015-12-05 ENCOUNTER — Ambulatory Visit
Admission: RE | Admit: 2015-12-05 | Discharge: 2015-12-05 | Disposition: A | Discharge status: Home / Self Care | Payer: Medicaid Other | Source: Ambulatory Visit | Attending: Internal Medicine | Admitting: Internal Medicine

## 2015-12-05 DIAGNOSIS — N63 Unspecified lump in unspecified breast: Secondary | ICD-10-CM

## 2016-02-26 IMAGING — US US ABDOMEN COMPLETE
1 series · 14 of 25 positions shown · non-contrast
Comparison: None.

CLINICAL DATA: Cholecystectomy.  Abdominal pain .  Hepatitis-C.

EXAM:
ULTRASOUND ABDOMEN COMPLETE

[Series 1: us abdomen complete · 0.24mm/px · 14 of 82 slices shown]
[im 1/82]
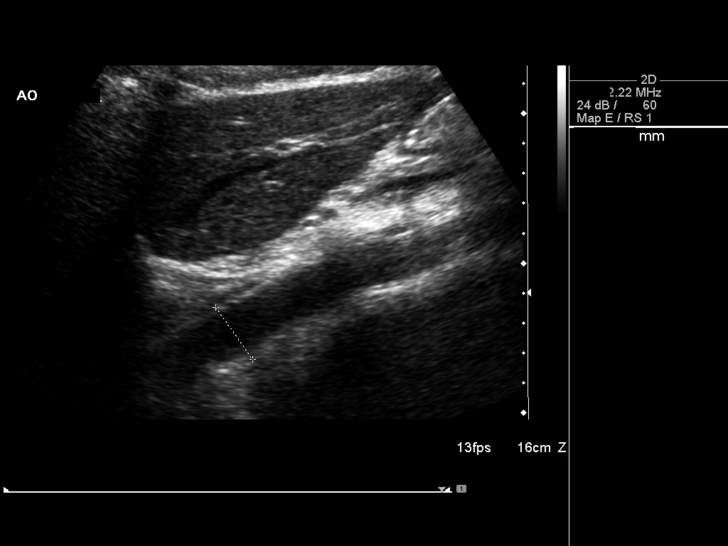
[im 7/82]
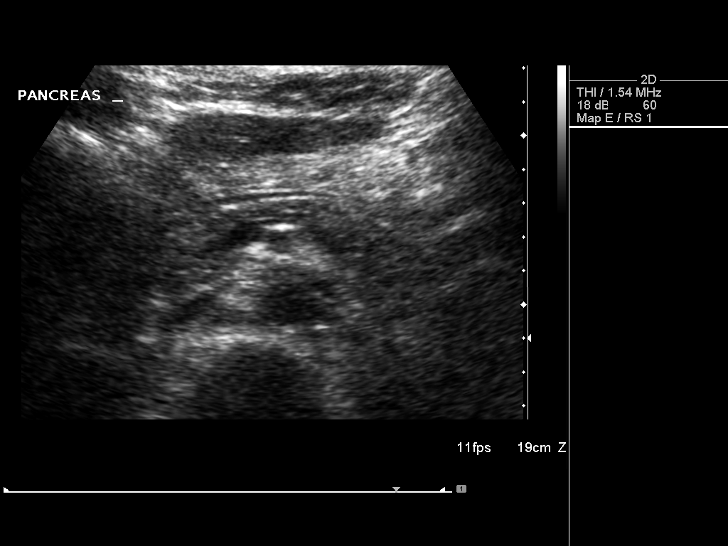
[im 14/82]
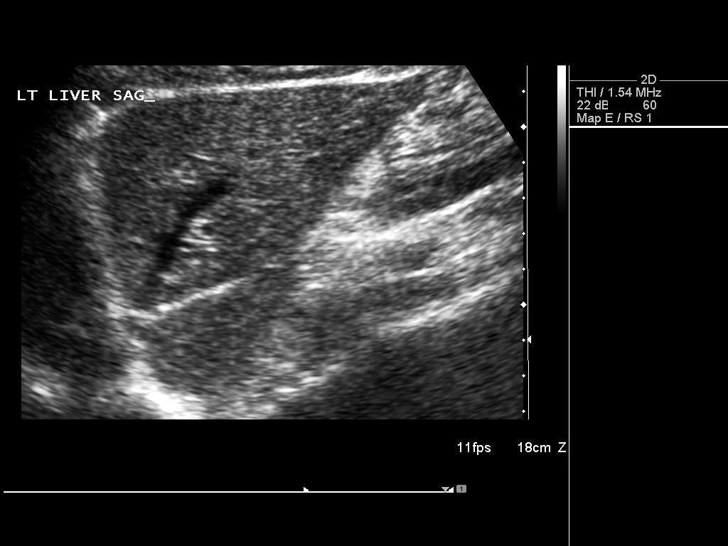
[im 21/82]
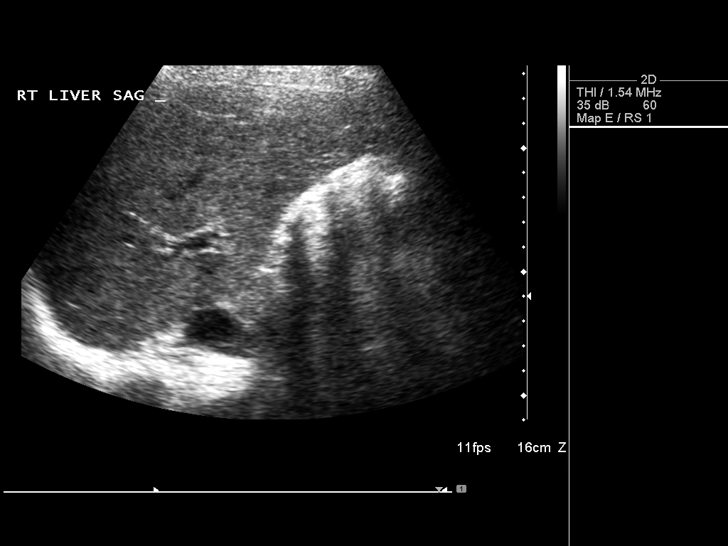
[im 28/82]
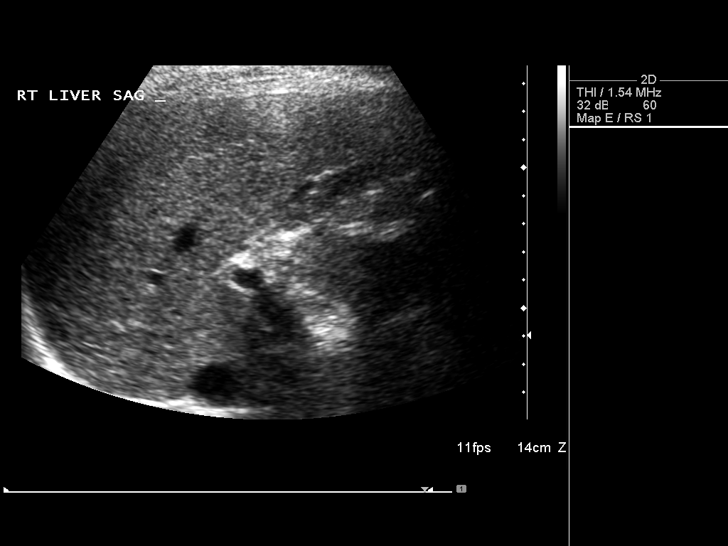
[im 31/82]
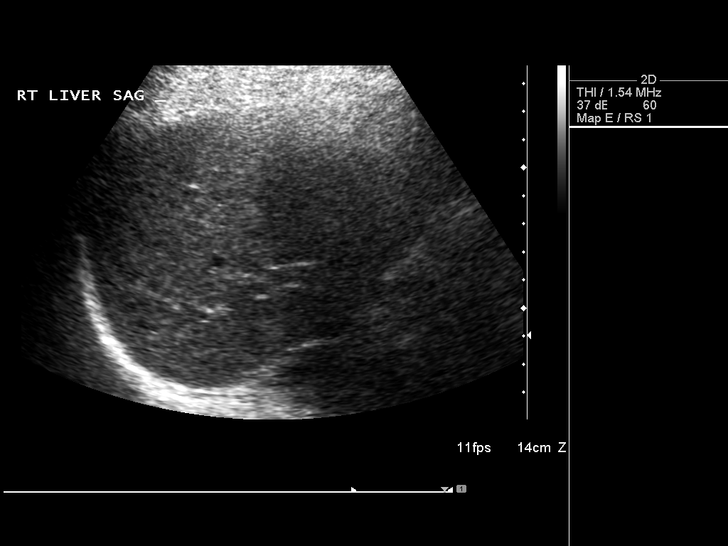
[im 38/82]
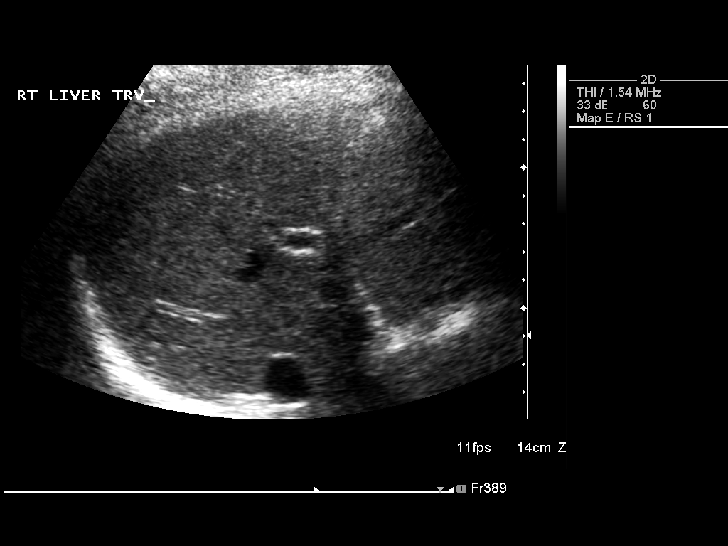
[im 44/82]
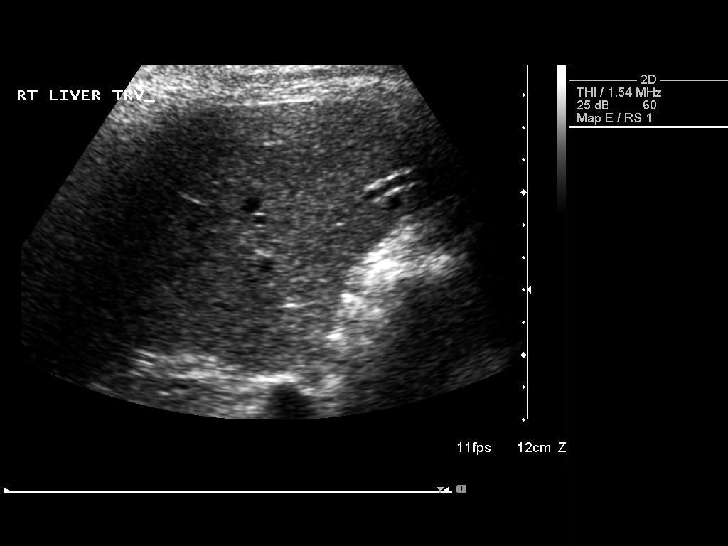
[im 51/82]
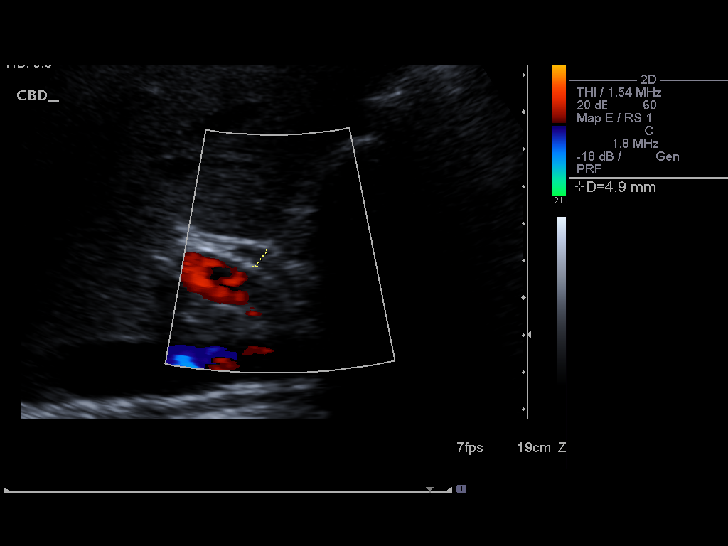
[im 55/82]
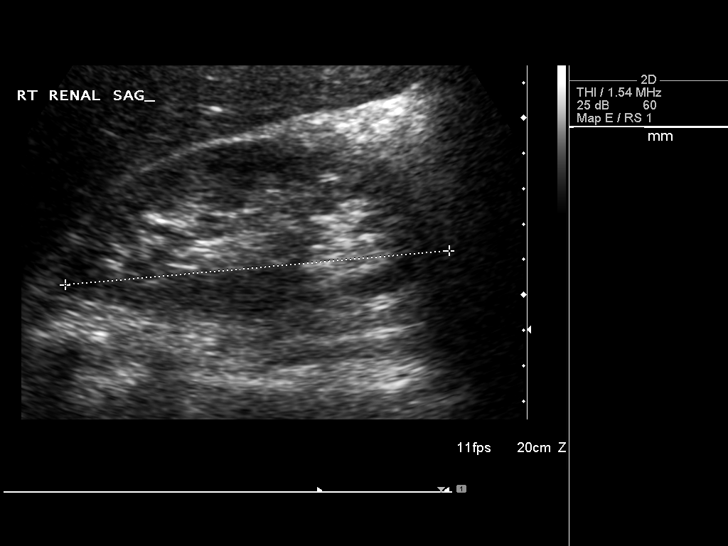
[im 61/82]
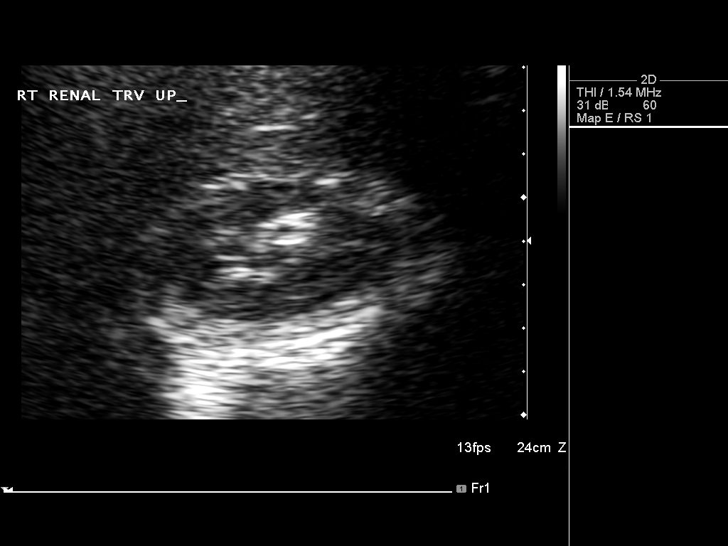
[im 68/82]
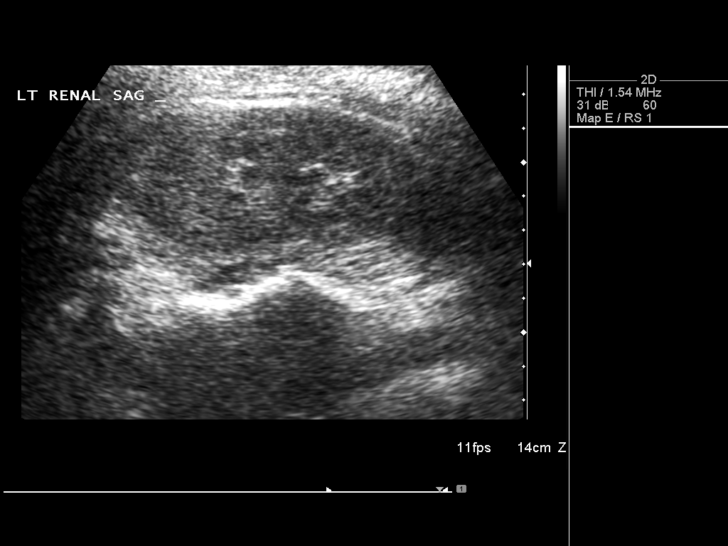
[im 75/82]
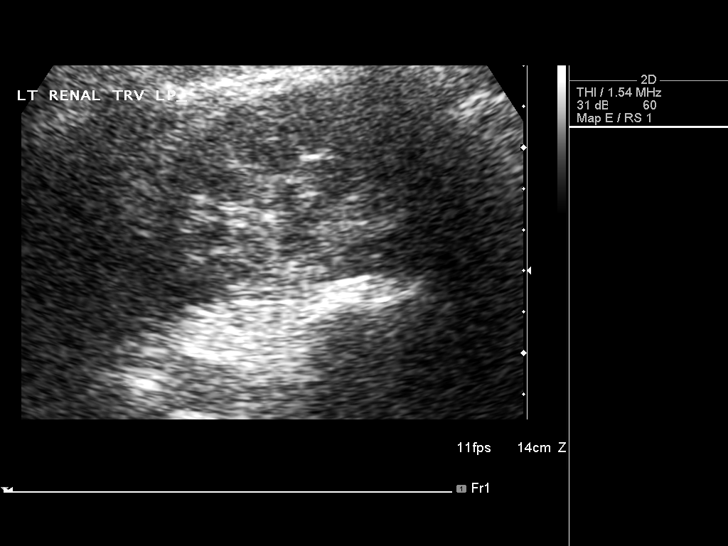
[im 82/82]
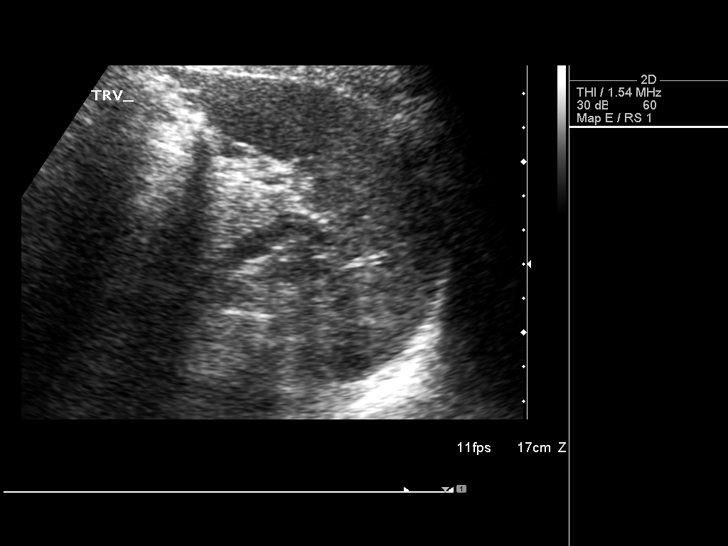

[14 of 25 positions shown; findings below may reference images not displayed]

FINDINGS: Gallbladder: Cholecystectomy.

Common bile duct: Diameter: 4.9 mm

Liver: No focal lesion identified. Within normal limits in
parenchymal echogenicity.

IVC: No abnormality visualized.

Pancreas: Visualized portion unremarkable.

Spleen: Size and appearance within normal limits.

Right Kidney: Length: 11.8 cm. Echogenicity within normal limits. No
mass or hydronephrosis visualized.

Left Kidney: Length: 12.3 cm. Echogenicity within normal limits. No
mass or hydronephrosis visualized.

Abdominal aorta: No aneurysm visualized.

Other findings: None.
IMPRESSION: No significant abnormality. Cholecystectomy. No biliary distention
or focal hepatic abnormality.

## 2017-02-14 IMAGING — US US BREAST 10
1 series · 6 of 6 positions shown · non-contrast
Comparison: Previous exam(s).

CLINICAL DATA: 39-year-old female presenting for evaluation of
palpable areas right breast. The patient had an ultrasound-guided
biopsy of the mass 0230 demonstrating a fibroadenoma, however the
patient feels this has increased in size.

EXAM:
2D DIGITAL DIAGNOSTIC BILATERAL MAMMOGRAM WITH CAD AND ADJUNCT TOMO
RIGHT BREAST ULTRASOUND

[Series 1: us breast 10 · 0.07mm/px · 6 of 6 slices shown]
[im 1/6]
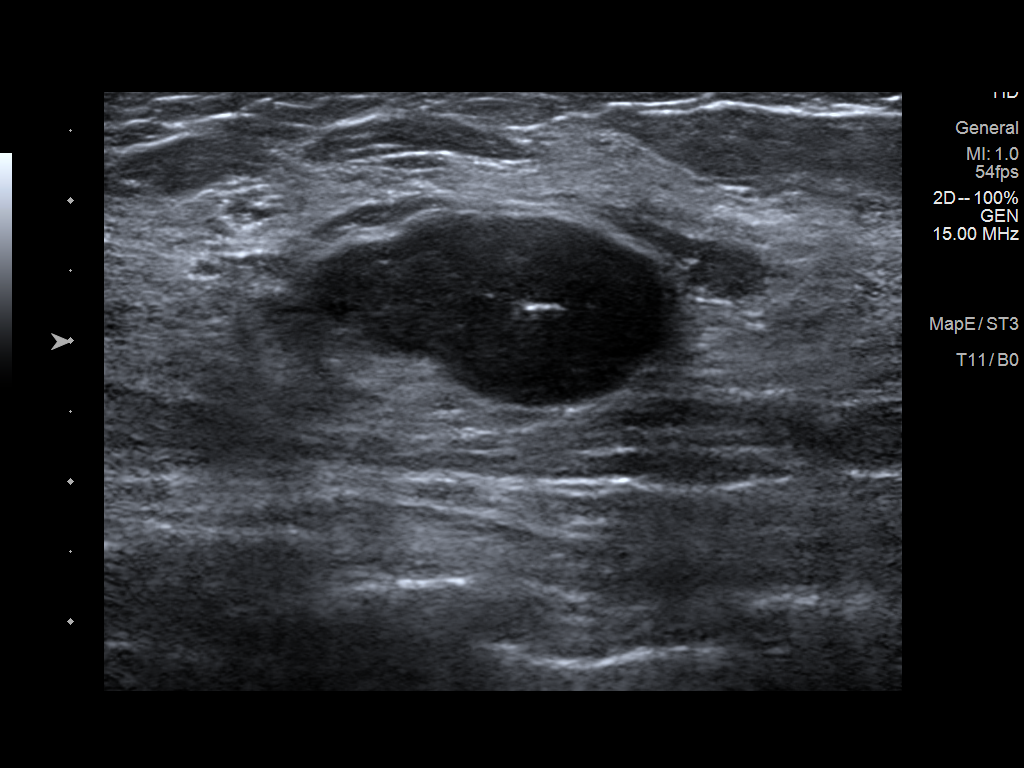
[im 2/6]
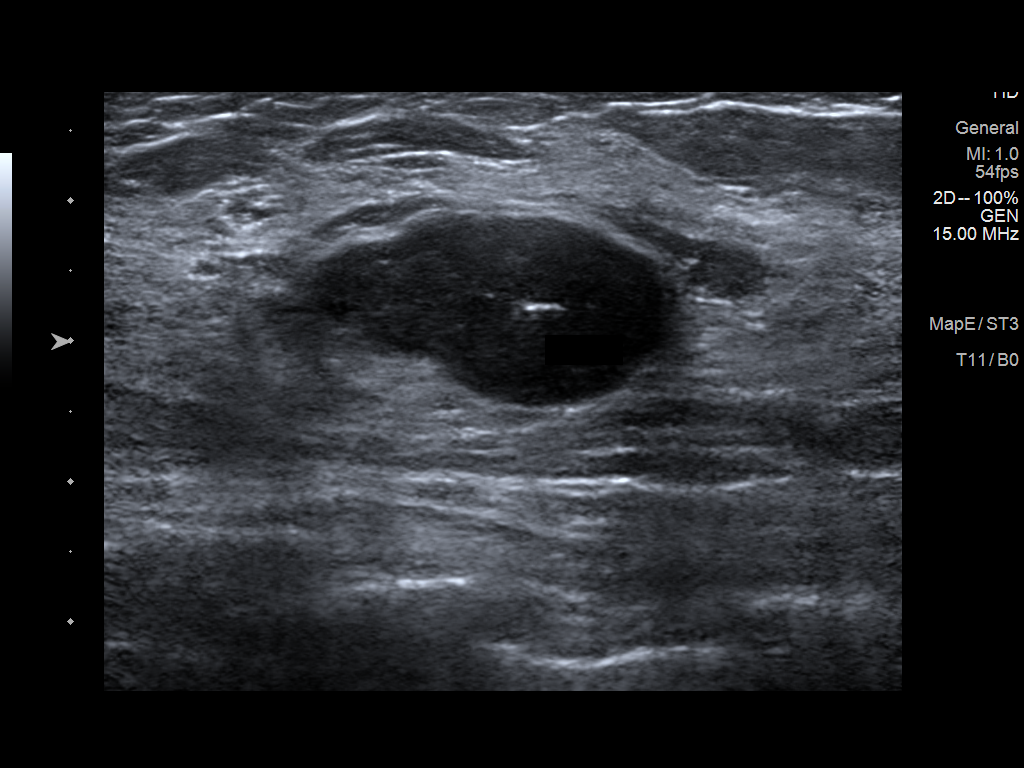
[im 3/6]
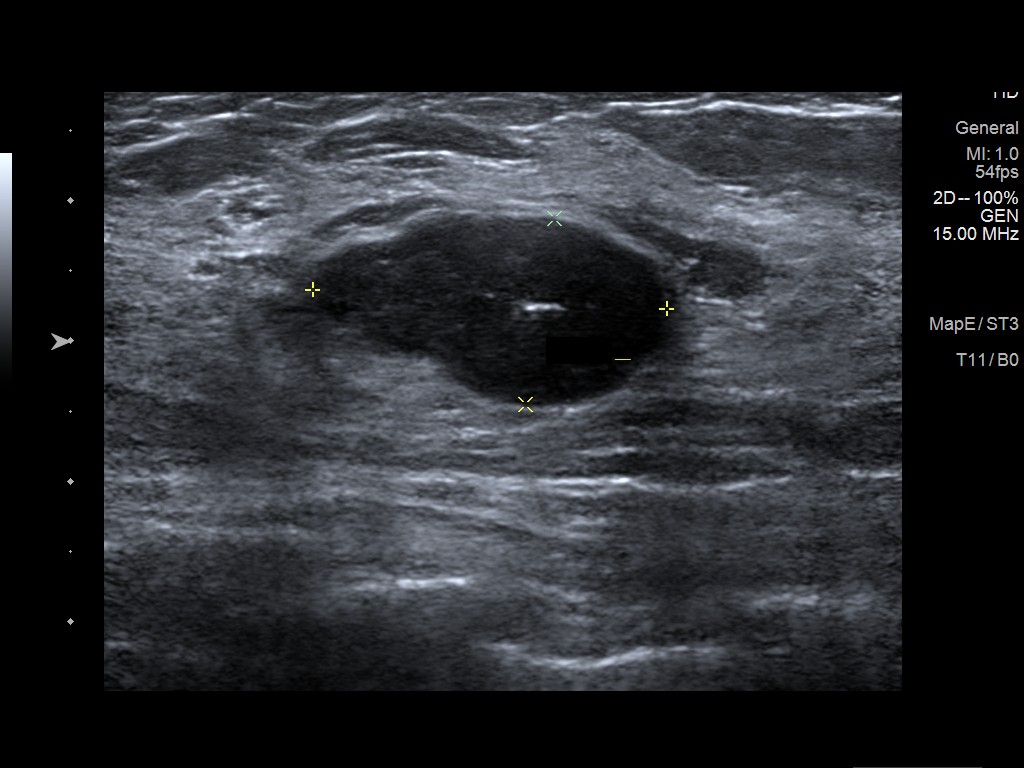
[im 4/6]
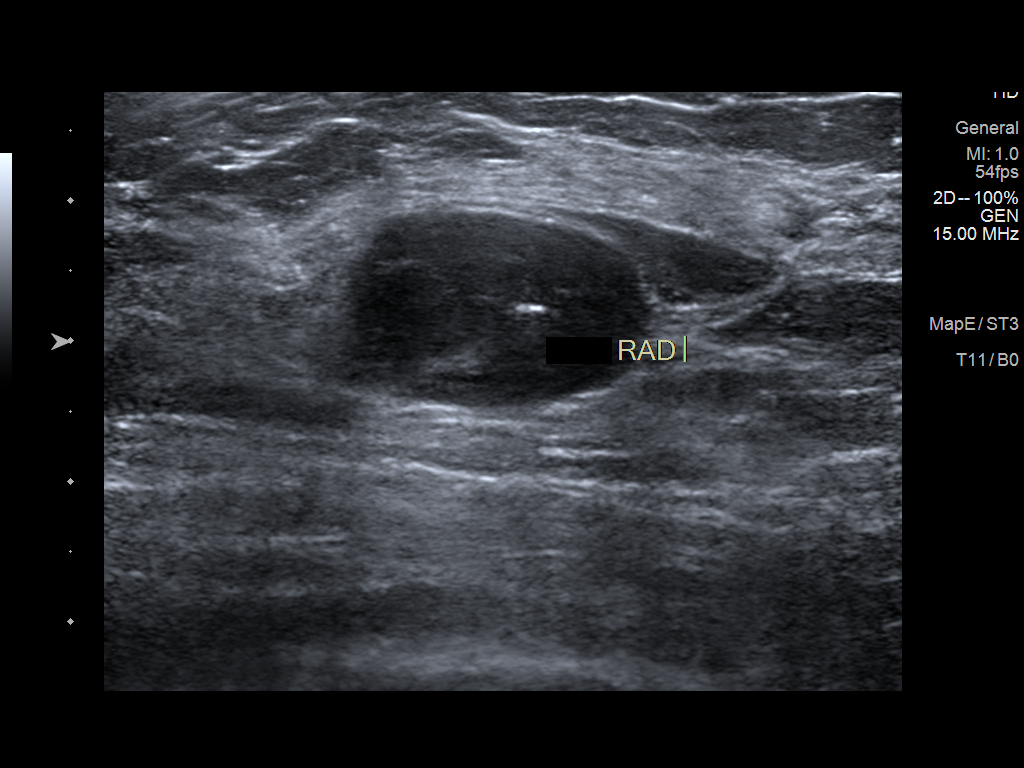
[im 5/6]
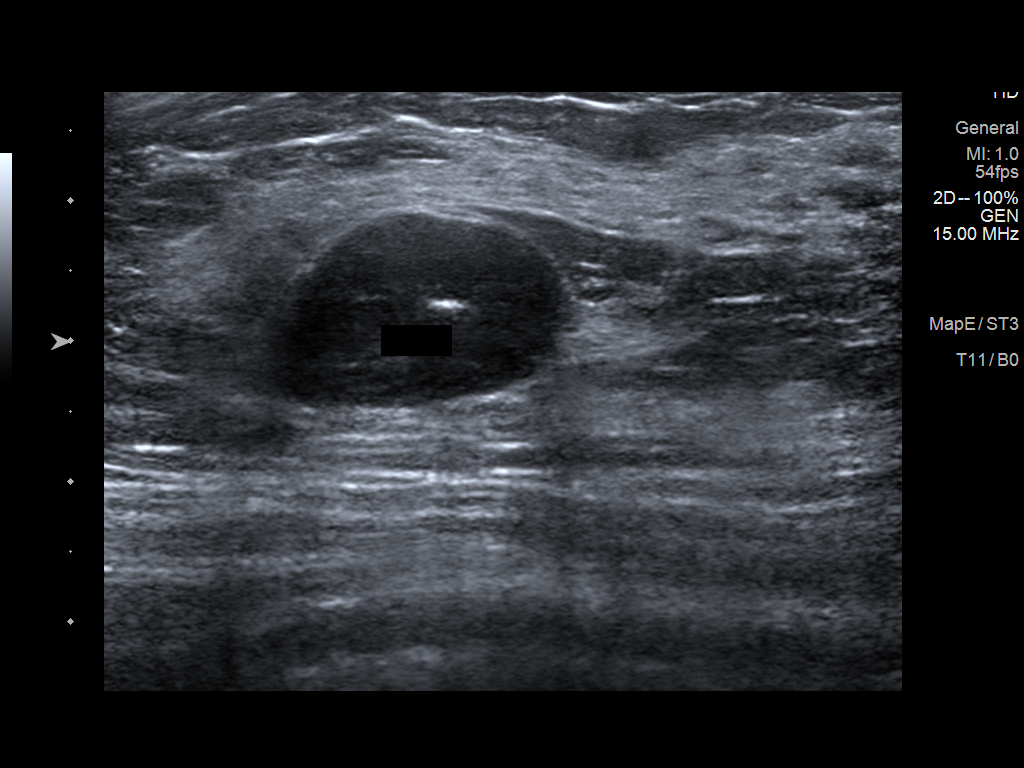
[im 6/6]
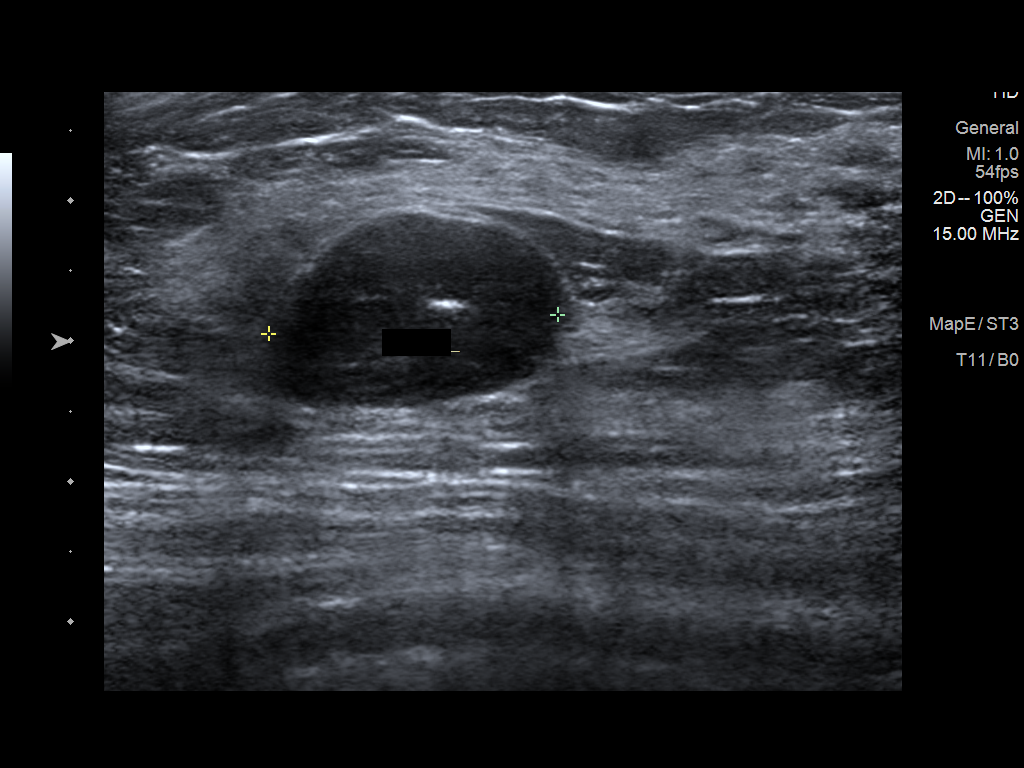

[6 of 6 positions shown; findings below may reference images not displayed]

ACR Breast Density Category c: The breast tissue is heterogeneously
dense, which may obscure small masses.
FINDINGS: The ribbon shaped biopsy marking clip is again seen within the mass
in the upper-outer right breast. Mammographically, the mass measures
approximately 2.4 cm, not significantly changed from the prior exam.
On the initial routine images of the left breast, there was an
asymmetry in the lateral aspect of the breast which resolves on
additional spot compression tomosynthesis images, consistent
overlapping fibroglandular tissue. No other suspicious
calcifications, masses or areas of distortion are seen in the
bilateral breasts.

Mammographic images were processed with CAD.

Physical exam of the upper-outer right breast demonstrates a firm
palpable mobile mass at the 10 o'clock position.

Ultrasound targeted to the palpable area of concern demonstrates a
hypoechoic circumscribed mass measuring approximately 2.5 x 1.3 x
2.1 cm. The difference in measurement as compared to the 0230
ultrasound is favored to be due to difference in scan plane at
measurement given the stability mammographically. A linear echogenic
focus is seen within the mass, consistent with the biopsy marking
clip.
IMPRESSION: 1. The palpable mass in the upper-outer right breast corresponds
with a previously biopsied fibroadenoma. Mammographically, there has
been no interval change in size since [DATE].  No mammographic evidence of malignancy in the bilateral breasts.

RECOMMENDATION:
Screening mammogram at age 40 unless there are persistent or
intervening clinical concerns. (Code:[id number])

I have discussed the findings and recommendations with the patient.
Results were also provided in writing at the conclusion of the
visit. If applicable, a reminder letter will be sent to the patient
regarding the next appointment.

BI-RADS CATEGORY  2: Benign.

## 2017-03-30 ENCOUNTER — Other Ambulatory Visit: Payer: Self-pay | Admitting: Neurosurgery

## 2017-04-06 ENCOUNTER — Encounter (HOSPITAL_COMMUNITY)
Admission: RE | Admit: 2017-04-06 | Discharge: 2017-04-06 | Disposition: A | Discharge status: Home / Self Care | Payer: Medicaid Other | Source: Ambulatory Visit | Attending: Neurosurgery | Admitting: Neurosurgery

## 2017-04-06 ENCOUNTER — Encounter (HOSPITAL_COMMUNITY): Payer: Self-pay

## 2017-04-06 DIAGNOSIS — Z9049 Acquired absence of other specified parts of digestive tract: Secondary | ICD-10-CM | POA: Insufficient documentation

## 2017-04-06 DIAGNOSIS — Z01812 Encounter for preprocedural laboratory examination: Secondary | ICD-10-CM | POA: Insufficient documentation

## 2017-04-06 DIAGNOSIS — M4316 Spondylolisthesis, lumbar region: Secondary | ICD-10-CM | POA: Insufficient documentation

## 2017-04-06 DIAGNOSIS — Z0183 Encounter for blood typing: Secondary | ICD-10-CM | POA: Diagnosis not present

## 2017-04-06 DIAGNOSIS — Z79899 Other long term (current) drug therapy: Secondary | ICD-10-CM | POA: Diagnosis not present

## 2017-04-06 DIAGNOSIS — F17200 Nicotine dependence, unspecified, uncomplicated: Secondary | ICD-10-CM | POA: Diagnosis not present

## 2017-04-06 DIAGNOSIS — Z01818 Encounter for other preprocedural examination: Secondary | ICD-10-CM | POA: Diagnosis not present

## 2017-04-06 DIAGNOSIS — B182 Chronic viral hepatitis C: Secondary | ICD-10-CM | POA: Insufficient documentation

## 2017-04-06 DIAGNOSIS — J45909 Unspecified asthma, uncomplicated: Secondary | ICD-10-CM | POA: Insufficient documentation

## 2017-04-06 HISTORY — DX: Fibromyalgia: M79.7

## 2017-04-06 HISTORY — DX: Unspecified asthma, uncomplicated: J45.909

## 2017-04-06 LAB — CBC WITH DIFFERENTIAL/PLATELET
Basophils Absolute: 0.1 10*3/uL (ref 0.0–0.1)
Basophils Relative: 1 %
EOS ABS: 0.1 10*3/uL (ref 0.0–0.7)
EOS PCT: 1 %
HCT: 42.5 % (ref 36.0–46.0)
HEMOGLOBIN: 14 g/dL (ref 12.0–15.0)
LYMPHS ABS: 3.5 10*3/uL (ref 0.7–4.0)
Lymphocytes Relative: 35 %
MCH: 30.4 pg (ref 26.0–34.0)
MCHC: 32.9 g/dL (ref 30.0–36.0)
MCV: 92.2 fL (ref 78.0–100.0)
MONOS PCT: 6 %
Monocytes Absolute: 0.6 10*3/uL (ref 0.1–1.0)
Neutro Abs: 5.7 10*3/uL (ref 1.7–7.7)
Neutrophils Relative %: 57 %
PLATELETS: 326 10*3/uL (ref 150–400)
RBC: 4.61 MIL/uL (ref 3.87–5.11)
RDW: 14.3 % (ref 11.5–15.5)
WBC: 10 10*3/uL (ref 4.0–10.5)

## 2017-04-06 LAB — SURGICAL PCR SCREEN
MRSA, PCR: POSITIVE — AB
Staphylococcus aureus: POSITIVE — AB

## 2017-04-06 LAB — TYPE AND SCREEN
ABO/RH(D): A POS
Antibody Screen: NEGATIVE

## 2017-04-06 LAB — COMPREHENSIVE METABOLIC PANEL
ALBUMIN: 4.3 g/dL (ref 3.5–5.0)
ALK PHOS: 112 U/L (ref 38–126)
ALT: 35 U/L (ref 14–54)
AST: 28 U/L (ref 15–41)
Anion gap: 8 (ref 5–15)
BILIRUBIN TOTAL: 0.5 mg/dL (ref 0.3–1.2)
BUN: 13 mg/dL (ref 6–20)
CALCIUM: 9.1 mg/dL (ref 8.9–10.3)
CO2: 25 mmol/L (ref 22–32)
Chloride: 106 mmol/L (ref 101–111)
Creatinine, Ser: 0.74 mg/dL (ref 0.44–1.00)
GFR calc Af Amer: >60 mL/min (ref >60)
GFR calc non Af Amer: >60 mL/min (ref >60)
GLUCOSE: 77 mg/dL (ref 65–99)
Potassium: 3.6 mmol/L (ref 3.5–5.1)
Sodium: 139 mmol/L (ref 135–145)
TOTAL PROTEIN: 7.1 g/dL (ref 6.5–8.1)

## 2017-04-06 LAB — ABO/RH: ABO/RH(D): A POS

## 2017-04-06 LAB — HCG, SERUM, QUALITATIVE: Preg, Serum: NEGATIVE

## 2017-04-06 MED ORDER — CHLORHEXIDINE GLUCONATE CLOTH 2 % EX PADS: 6.0000 | MEDICATED_PAD | Freq: Once | CUTANEOUS | Status: DC

## 2017-04-06 NOTE — Pre-Procedure Instructions (Addendum)
Cathy Santos  04/06/2017      [facility] - [place], [place] - [address] [place] [address] Phone: [phone] Fax: [phone]    Your procedure is scheduled on April 11, 2017.  Report to [facility] Admitting at 1110 AM.  Call this number if you have problems the morning of surgery:  [phone]   Remember:  Do not eat food or drink liquids after midnight.  Take these medicines the morning of surgery with A SIP OF WATER albuterol inhaler (bring inhaler with you)-if needed, alprazolam (xanax)-if needed, bupropion (wellbutrin), gabapentin (neurontin), oxycodone-acetaminophen (percocet), ondansetron (zofran)-if needed.  7 days prior to surgery STOP taking any diclofenac gel, Aspirin, Aleve, Naproxen, Ibuprofen, Motrin, Advil, Goody's, BC's, all herbal medications, fish oil, and all vitamins   Do not wear jewelry, make-up or nail polish.  Do not wear lotions, powders, or perfumes, or deoderant.  Do not shave 48 hours prior to surgery.    Do not bring valuables to the hospital.  [facility] is not responsible for any belongings or valuables.  Contacts, dentures or bridgework may not be worn into surgery.  Leave your suitcase in the car.  After surgery it may be brought to your room.  For patients admitted to the hospital, discharge time will be determined by your treatment team.  Patients discharged the day of surgery will not be allowed to drive home.   Special instructions:   [place]- Preparing For Surgery  Before surgery, you can play an important role. Because skin is not sterile, your skin needs to be as free of germs as possible. You can reduce the number of germs on your skin by washing with CHG (chlorahexidine gluconate) Soap before surgery.  CHG is an antiseptic cleaner which kills germs and bonds with the skin to continue killing germs even after washing.  Please do not use if you  have an allergy to CHG or antibacterial soaps. If your skin becomes reddened/irritated stop using the CHG.  Do not shave (including legs and underarms) for at least 48 hours prior to first CHG shower. It is OK to shave your face.  Please follow these instructions carefully.   1. Shower the NIGHT BEFORE SURGERY and the MORNING OF SURGERY with CHG.   2. If you chose to wash your hair, wash your hair first as usual with your normal shampoo.  3. After you shampoo, rinse your hair and body thoroughly to remove the shampoo.  4. Use CHG as you would any other liquid soap. You can apply CHG directly to the skin and wash gently with a scrungie or a clean washcloth.   5. Apply the CHG Soap to your body ONLY FROM THE NECK DOWN.  Do not use on open wounds or open sores. Avoid contact with your eyes, ears, mouth and genitals (private parts). Wash genitals (private parts) with your normal soap.  6. Wash thoroughly, paying special attention to the area where your surgery will be performed.  7. Thoroughly rinse your body with warm water from the neck down.  8. DO NOT shower/wash with your normal soap after using and rinsing off the CHG Soap.  9. Pat yourself dry with a CLEAN TOWEL.   10. Wear CLEAN PAJAMAS   11. Place CLEAN SHEETS on your bed the night of your first shower and DO NOT SLEEP WITH PETS.   Day of Surgery: Do not apply any deodorants/lotions. Please  wear clean clothes to the hospital/surgery center.     Please read over the following fact sheets that you were given. Pain Booklet, Coughing and Deep Breathing, MRSA Information and Surgical Site Infection Prevention

## 2017-04-06 NOTE — Progress Notes (Addendum)
HWY:SHUOH, Cheri Rous, MD  Cardiologist: pt denies  EKG: pt denies past year  Stress test: pt denies ever  ECHO:pt denies ever  Cardiac Cath:pt denies ever  Chest x-ray:pt denies past year

## 2017-04-07 NOTE — Progress Notes (Signed)
Anesthesia Chart Review:  Pt is a 41 year old female scheduled for L4-5, L5-S1 PLIF on 04/11/2017 with Julio Sicks, MD  - PCP is Earlie Lou, MD  PMH includes:  Chronic hepatitis C, asthma. Current smoker. BMI 28. S/p cholecystectomy 07/03/11.   Medications include: Albuterol, Seroquel  Preoperative labs reviewed.    EKG 04/06/17: NSR. Incomplete RBBB.   If no changes, I anticipate pt can proceed with surgery as scheduled.   Rica Mast, FNP-BC [facility]/Anesthesiology Phone: [phone] 04/07/2017 10:36 AM

## 2017-04-10 MED ORDER — VANCOMYCIN HCL 10 G IV SOLR
1250.0000 mg | INTRAVENOUS | Status: DC
  Filled 2017-04-10: qty 1250

## 2017-04-11 ENCOUNTER — Inpatient Hospital Stay (HOSPITAL_COMMUNITY): Payer: Medicaid Other

## 2017-04-11 ENCOUNTER — Inpatient Hospital Stay (HOSPITAL_COMMUNITY)
Admission: RE | Disposition: A | Discharge status: Home / Self Care | Payer: Self-pay | Source: Ambulatory Visit | Attending: Neurosurgery

## 2017-04-11 ENCOUNTER — Inpatient Hospital Stay (HOSPITAL_COMMUNITY): Payer: Medicaid Other | Admitting: Anesthesiology

## 2017-04-11 ENCOUNTER — Inpatient Hospital Stay (HOSPITAL_COMMUNITY): Payer: Medicaid Other | Admitting: Emergency Medicine

## 2017-04-11 ENCOUNTER — Inpatient Hospital Stay (HOSPITAL_COMMUNITY)
Admission: RE | Admit: 2017-04-11 | Discharge: 2017-04-14 | DRG: 455 | Disposition: A | Discharge status: Home / Self Care | Payer: Medicaid Other | Source: Ambulatory Visit | Attending: Neurosurgery | Admitting: Neurosurgery

## 2017-04-11 ENCOUNTER — Encounter (HOSPITAL_COMMUNITY): Payer: Self-pay

## 2017-04-11 DIAGNOSIS — F17210 Nicotine dependence, cigarettes, uncomplicated: Secondary | ICD-10-CM | POA: Diagnosis present

## 2017-04-11 DIAGNOSIS — Z419 Encounter for procedure for purposes other than remedying health state, unspecified: Secondary | ICD-10-CM

## 2017-04-11 DIAGNOSIS — M549 Dorsalgia, unspecified: Secondary | ICD-10-CM | POA: Diagnosis present

## 2017-04-11 DIAGNOSIS — M797 Fibromyalgia: Secondary | ICD-10-CM | POA: Diagnosis present

## 2017-04-11 DIAGNOSIS — M4317 Spondylolisthesis, lumbosacral region: Secondary | ICD-10-CM | POA: Diagnosis present

## 2017-04-11 DIAGNOSIS — Z888 Allergy status to other drugs, medicaments and biological substances status: Secondary | ICD-10-CM

## 2017-04-11 DIAGNOSIS — B182 Chronic viral hepatitis C: Secondary | ICD-10-CM | POA: Diagnosis present

## 2017-04-11 DIAGNOSIS — M5136 Other intervertebral disc degeneration, lumbar region: Secondary | ICD-10-CM | POA: Diagnosis present

## 2017-04-11 DIAGNOSIS — J45909 Unspecified asthma, uncomplicated: Secondary | ICD-10-CM | POA: Diagnosis present

## 2017-04-11 HISTORY — DX: Spondylolisthesis, lumbar region: M43.16

## 2017-04-11 SURGERY — POSTERIOR LUMBAR FUSION 2 LEVEL
Anesthesia: General | Site: Back

## 2017-04-11 MED ORDER — OXYCODONE HCL 5 MG PO TABS
5.0000 mg | ORAL_TABLET | Freq: Once | ORAL | Status: AC | PRN
  Administered 2017-04-11: 5 mg via ORAL

## 2017-04-11 MED ORDER — LIDOCAINE HCL (CARDIAC) 20 MG/ML IV SOLN
INTRAVENOUS | Status: DC | PRN
  Administered 2017-04-11: 100 mg via INTRAVENOUS

## 2017-04-11 MED ORDER — MIDAZOLAM HCL 2 MG/2ML IJ SOLN
INTRAMUSCULAR | Status: AC
  Filled 2017-04-11: qty 2

## 2017-04-11 MED ORDER — 0.9 % SODIUM CHLORIDE (POUR BTL) OPTIME
TOPICAL | Status: DC | PRN
  Administered 2017-04-11: 1000 mL

## 2017-04-11 MED ORDER — HYDROMORPHONE HCL 1 MG/ML IJ SOLN
INTRAMUSCULAR | Status: AC
  Filled 2017-04-11: qty 1

## 2017-04-11 MED ORDER — FENTANYL CITRATE (PF) 250 MCG/5ML IJ SOLN
INTRAMUSCULAR | Status: AC
  Filled 2017-04-11: qty 5

## 2017-04-11 MED ORDER — ALBUTEROL SULFATE (2.5 MG/3ML) 0.083% IN NEBU
2.5000 mg | INHALATION_SOLUTION | Freq: Four times a day (QID) | RESPIRATORY_TRACT | Status: DC | PRN
  Administered 2017-04-13: 2.5 mg via RESPIRATORY_TRACT
  Filled 2017-04-11: qty 3

## 2017-04-11 MED ORDER — ONDANSETRON HCL 4 MG/2ML IJ SOLN
INTRAMUSCULAR | Status: DC | PRN
  Administered 2017-04-11: 4 mg via INTRAVENOUS

## 2017-04-11 MED ORDER — SODIUM CHLORIDE 0.9 % IR SOLN
Status: DC | PRN
  Administered 2017-04-11: 500 mL

## 2017-04-11 MED ORDER — OXYCODONE HCL 5 MG/5ML PO SOLN: 5.0000 mg | Freq: Once | ORAL | Status: AC | PRN

## 2017-04-11 MED ORDER — CEFAZOLIN SODIUM-DEXTROSE 2-3 GM-% IV SOLR
INTRAVENOUS | Status: DC | PRN
  Administered 2017-04-11: 2 g via INTRAVENOUS

## 2017-04-11 MED ORDER — DIAZEPAM 5 MG PO TABS
5.0000 mg | ORAL_TABLET | Freq: Four times a day (QID) | ORAL | Status: DC | PRN
  Administered 2017-04-11 (×2): 10 mg via ORAL
  Administered 2017-04-12: 5 mg via ORAL
  Administered 2017-04-12 (×2): 10 mg via ORAL
  Administered 2017-04-13 (×3): 5 mg via ORAL
  Filled 2017-04-11 (×2): qty 2
  Filled 2017-04-11: qty 1
  Filled 2017-04-11: qty 2
  Filled 2017-04-11 (×3): qty 1

## 2017-04-11 MED ORDER — LACTATED RINGERS IV SOLN
INTRAVENOUS | Status: DC
  Administered 2017-04-11 (×4): via INTRAVENOUS

## 2017-04-11 MED ORDER — THROMBIN 20000 UNITS EX SOLR
CUTANEOUS | Status: AC
  Filled 2017-04-11: qty 20000

## 2017-04-11 MED ORDER — ONDANSETRON 4 MG PO TBDP: 4.0000 mg | ORAL_TABLET | ORAL | Status: DC | PRN

## 2017-04-11 MED ORDER — PROPOFOL 10 MG/ML IV BOLUS
INTRAVENOUS | Status: DC | PRN
  Administered 2017-04-11: 180 mg via INTRAVENOUS

## 2017-04-11 MED ORDER — BUPROPION HCL ER (XL) 150 MG PO TB24
150.0000 mg | ORAL_TABLET | Freq: Every morning | ORAL | Status: DC
  Administered 2017-04-12 – 2017-04-14 (×3): 150 mg via ORAL
  Filled 2017-04-11 (×3): qty 1

## 2017-04-11 MED ORDER — ONDANSETRON HCL 4 MG/2ML IJ SOLN: 4.0000 mg | Freq: Four times a day (QID) | INTRAMUSCULAR | Status: DC | PRN

## 2017-04-11 MED ORDER — OXYCODONE HCL 5 MG PO TABS
ORAL_TABLET | ORAL | Status: AC
  Filled 2017-04-11: qty 1

## 2017-04-11 MED ORDER — SODIUM CHLORIDE 0.9% FLUSH
3.0000 mL | Freq: Two times a day (BID) | INTRAVENOUS | Status: DC
  Administered 2017-04-11 – 2017-04-12 (×3): 3 mL via INTRAVENOUS

## 2017-04-11 MED ORDER — HYDROMORPHONE HCL 1 MG/ML IJ SOLN
0.2500 mg | INTRAMUSCULAR | Status: DC | PRN
  Administered 2017-04-11 (×4): 0.5 mg via INTRAVENOUS

## 2017-04-11 MED ORDER — BUPIVACAINE HCL 0.5 % IJ SOLN
INTRAMUSCULAR | Status: DC | PRN
  Administered 2017-04-11: 20 mL

## 2017-04-11 MED ORDER — PHENYLEPHRINE 40 MCG/ML (10ML) SYRINGE FOR IV PUSH (FOR BLOOD PRESSURE SUPPORT)
PREFILLED_SYRINGE | INTRAVENOUS | Status: AC
  Filled 2017-04-11: qty 10

## 2017-04-11 MED ORDER — OXYCODONE-ACETAMINOPHEN 5-325 MG PO TABS
1.0000 | ORAL_TABLET | ORAL | Status: DC | PRN
  Administered 2017-04-11 – 2017-04-13 (×10): 2 via ORAL
  Filled 2017-04-11 (×10): qty 2

## 2017-04-11 MED ORDER — VANCOMYCIN HCL 1000 MG IV SOLR
INTRAVENOUS | Status: AC
  Filled 2017-04-11: qty 1000

## 2017-04-11 MED ORDER — PROPOFOL 10 MG/ML IV BOLUS
INTRAVENOUS | Status: AC
  Filled 2017-04-11: qty 20

## 2017-04-11 MED ORDER — PHENOL 1.4 % MT LIQD: 1.0000 | OROMUCOSAL | Status: DC | PRN

## 2017-04-11 MED ORDER — QUETIAPINE FUMARATE 200 MG PO TABS
200.0000 mg | ORAL_TABLET | Freq: Every day | ORAL | Status: DC
  Administered 2017-04-11 – 2017-04-13 (×3): 200 mg via ORAL
  Filled 2017-04-11 (×4): qty 1

## 2017-04-11 MED ORDER — SODIUM CHLORIDE 0.9% FLUSH: 3.0000 mL | INTRAVENOUS | Status: DC | PRN

## 2017-04-11 MED ORDER — THROMBIN 20000 UNITS EX SOLR
CUTANEOUS | Status: DC | PRN
  Administered 2017-04-11 (×2): 20 mL via TOPICAL

## 2017-04-11 MED ORDER — DIAZEPAM 5 MG PO TABS
ORAL_TABLET | ORAL | Status: AC
  Filled 2017-04-11: qty 2

## 2017-04-11 MED ORDER — PHENYLEPHRINE HCL 10 MG/ML IJ SOLN
INTRAMUSCULAR | Status: DC | PRN
  Administered 2017-04-11: 80 ug via INTRAVENOUS

## 2017-04-11 MED ORDER — MENTHOL 3 MG MT LOZG: 1.0000 | LOZENGE | OROMUCOSAL | Status: DC | PRN

## 2017-04-11 MED ORDER — CHLORHEXIDINE GLUCONATE CLOTH 2 % EX PADS
6.0000 | MEDICATED_PAD | Freq: Every day | CUTANEOUS | Status: DC
  Administered 2017-04-12 – 2017-04-14 (×3): 6 via TOPICAL

## 2017-04-11 MED ORDER — GABAPENTIN 600 MG PO TABS
600.0000 mg | ORAL_TABLET | Freq: Four times a day (QID) | ORAL | Status: DC
  Administered 2017-04-11 – 2017-04-14 (×10): 600 mg via ORAL
  Filled 2017-04-11 (×10): qty 1

## 2017-04-11 MED ORDER — FENTANYL CITRATE (PF) 100 MCG/2ML IJ SOLN
INTRAMUSCULAR | Status: DC | PRN
  Administered 2017-04-11 (×4): 50 ug via INTRAVENOUS
  Administered 2017-04-11: 100 ug via INTRAVENOUS
  Administered 2017-04-11: 50 ug via INTRAVENOUS
  Administered 2017-04-11: 100 ug via INTRAVENOUS
  Administered 2017-04-11: 50 ug via INTRAVENOUS

## 2017-04-11 MED ORDER — ROCURONIUM BROMIDE 100 MG/10ML IV SOLN
INTRAVENOUS | Status: DC | PRN
  Administered 2017-04-11: 20 mg via INTRAVENOUS
  Administered 2017-04-11: 10 mg via INTRAVENOUS
  Administered 2017-04-11: 50 mg via INTRAVENOUS
  Administered 2017-04-11: 30 mg via INTRAVENOUS

## 2017-04-11 MED ORDER — ALPRAZOLAM 0.5 MG PO TABS
0.5000 mg | ORAL_TABLET | Freq: Two times a day (BID) | ORAL | Status: DC
  Administered 2017-04-11 – 2017-04-14 (×6): 0.5 mg via ORAL
  Filled 2017-04-11 (×6): qty 1

## 2017-04-11 MED ORDER — DEXAMETHASONE SODIUM PHOSPHATE 10 MG/ML IJ SOLN
10.0000 mg | INTRAMUSCULAR | Status: AC
  Administered 2017-04-11: 10 mg via INTRAVENOUS
  Filled 2017-04-11: qty 1

## 2017-04-11 MED ORDER — SUGAMMADEX SODIUM 200 MG/2ML IV SOLN
INTRAVENOUS | Status: DC | PRN
  Administered 2017-04-11: 200 mg via INTRAVENOUS

## 2017-04-11 MED ORDER — ONDANSETRON HCL 4 MG PO TABS: 4.0000 mg | ORAL_TABLET | Freq: Four times a day (QID) | ORAL | Status: DC | PRN

## 2017-04-11 MED ORDER — VANCOMYCIN HCL 1000 MG IV SOLR
INTRAVENOUS | Status: DC | PRN
  Administered 2017-04-11: 1000 mg via TOPICAL

## 2017-04-11 MED ORDER — MIDAZOLAM HCL 5 MG/5ML IJ SOLN
INTRAMUSCULAR | Status: DC | PRN
  Administered 2017-04-11: 2 mg via INTRAVENOUS

## 2017-04-11 MED ORDER — CEFAZOLIN SODIUM-DEXTROSE 1-4 GM/50ML-% IV SOLN
1.0000 g | Freq: Three times a day (TID) | INTRAVENOUS | Status: AC
  Administered 2017-04-11 – 2017-04-12 (×2): 1 g via INTRAVENOUS
  Filled 2017-04-11 (×2): qty 50

## 2017-04-11 MED ORDER — BUPIVACAINE HCL (PF) 0.5 % IJ SOLN
INTRAMUSCULAR | Status: AC
  Filled 2017-04-11: qty 30

## 2017-04-11 SURGICAL SUPPLY — 69 items
BAG DECANTER FOR FLEXI CONT (MISCELLANEOUS) ×2 IMPLANT
BENZOIN TINCTURE PRP APPL 2/3 (GAUZE/BANDAGES/DRESSINGS) ×2 IMPLANT
BLADE CLIPPER SURG (BLADE) IMPLANT
BUR CUTTER 7.0 ROUND (BURR) ×2 IMPLANT
BUR MATCHSTICK NEURO 3.0 LAGG (BURR) ×2 IMPLANT
CANISTER SUCT 3000ML PPV (MISCELLANEOUS) ×2 IMPLANT
CAP LCK SPNE (Orthopedic Implant) ×6 IMPLANT
CAP LOCK SPINE RADIUS (Orthopedic Implant) ×6 IMPLANT
CAP LOCKING (Orthopedic Implant) ×6 IMPLANT
CARTRIDGE OIL MAESTRO DRILL (MISCELLANEOUS) ×1 IMPLANT
CONT SPEC 4OZ CLIKSEAL STRL BL (MISCELLANEOUS) ×2 IMPLANT
COVER BACK TABLE 60X90IN (DRAPES) ×2 IMPLANT
DECANTER SPIKE VIAL GLASS SM (MISCELLANEOUS) ×2 IMPLANT
DERMABOND ADVANCED (GAUZE/BANDAGES/DRESSINGS) ×1
DERMABOND ADVANCED .7 DNX12 (GAUZE/BANDAGES/DRESSINGS) ×1 IMPLANT
DEVICE INTERBODY ELEVATE 23X8 (Cage) ×4 IMPLANT
DIFFUSER DRILL AIR PNEUMATIC (MISCELLANEOUS) ×2 IMPLANT
DRAPE C-ARM 42X72 X-RAY (DRAPES) ×4 IMPLANT
DRAPE HALF SHEET 40X57 (DRAPES) IMPLANT
DRAPE LAPAROTOMY 100X72X124 (DRAPES) ×2 IMPLANT
DRAPE POUCH INSTRU U-SHP 10X18 (DRAPES) ×2 IMPLANT
DRAPE SURG 17X23 STRL (DRAPES) ×8 IMPLANT
DRSG OPSITE POSTOP 4X8 (GAUZE/BANDAGES/DRESSINGS) ×2 IMPLANT
DURAPREP 26ML APPLICATOR (WOUND CARE) ×2 IMPLANT
ELECT REM PT RETURN 9FT ADLT (ELECTROSURGICAL) ×2
ELECTRODE REM PT RTRN 9FT ADLT (ELECTROSURGICAL) ×1 IMPLANT
EVACUATOR 1/8 PVC DRAIN (DRAIN) ×4 IMPLANT
GAUZE SPONGE 4X4 12PLY STRL (GAUZE/BANDAGES/DRESSINGS) IMPLANT
GAUZE SPONGE 4X4 16PLY XRAY LF (GAUZE/BANDAGES/DRESSINGS) IMPLANT
GLOVE BIO SURGEON STRL SZ 6.5 (GLOVE) ×2 IMPLANT
GLOVE BIOGEL PI IND STRL 6.5 (GLOVE) ×2 IMPLANT
GLOVE BIOGEL PI INDICATOR 6.5 (GLOVE) ×2
GLOVE ECLIPSE 6.5 STRL STRAW (GLOVE) ×2 IMPLANT
GLOVE ECLIPSE 9.0 STRL (GLOVE) ×4 IMPLANT
GLOVE EXAM NITRILE LRG STRL (GLOVE) IMPLANT
GLOVE EXAM NITRILE XL STR (GLOVE) IMPLANT
GLOVE EXAM NITRILE XS STR PU (GLOVE) IMPLANT
GOWN STRL REUS W/ TWL LRG LVL3 (GOWN DISPOSABLE) ×3 IMPLANT
GOWN STRL REUS W/ TWL XL LVL3 (GOWN DISPOSABLE) ×2 IMPLANT
GOWN STRL REUS W/TWL 2XL LVL3 (GOWN DISPOSABLE) ×2 IMPLANT
GOWN STRL REUS W/TWL LRG LVL3 (GOWN DISPOSABLE) ×3
GOWN STRL REUS W/TWL XL LVL3 (GOWN DISPOSABLE) ×2
KIT BASIN OR (CUSTOM PROCEDURE TRAY) ×2 IMPLANT
KIT ROOM TURNOVER OR (KITS) ×2 IMPLANT
MILL MEDIUM DISP (BLADE) ×4 IMPLANT
NEEDLE HYPO 22GX1.5 SAFETY (NEEDLE) ×2 IMPLANT
NS IRRIG 1000ML POUR BTL (IV SOLUTION) ×2 IMPLANT
OIL CARTRIDGE MAESTRO DRILL (MISCELLANEOUS) ×2
PACK LAMINECTOMY NEURO (CUSTOM PROCEDURE TRAY) ×2 IMPLANT
ROD 5.5X60MM PURPLE (Rod) ×2 IMPLANT
ROD 70MM (Rod) ×1 IMPLANT
ROD SPNL 70X5.5 NS TI RDS (Rod) ×1 IMPLANT
SCREW 5.75 X 635 (Screw) ×4 IMPLANT
SCREW 5.75X40M (Screw) ×8 IMPLANT
SPACER SPNL STD 23X8XSTRL (Cage) ×2 IMPLANT
SPACER SPNL XLORDOTIC 23X8X (Cage) ×2 IMPLANT
SPCR SPNL STD 23X8XSTRL (Cage) ×2 IMPLANT
SPCR SPNL XLORDOTIC 23X8X (Cage) ×2 IMPLANT
SPONGE LAP 4X18 X RAY DECT (DISPOSABLE) ×2 IMPLANT
SPONGE SURGIFOAM ABS GEL 100 (HEMOSTASIS) ×2 IMPLANT
STRIP CLOSURE SKIN 1/2X4 (GAUZE/BANDAGES/DRESSINGS) ×2 IMPLANT
SUT VIC AB 0 CT1 18XCR BRD8 (SUTURE) ×2 IMPLANT
SUT VIC AB 0 CT1 8-18 (SUTURE) ×2
SUT VIC AB 2-0 CT1 18 (SUTURE) ×4 IMPLANT
SUT VIC AB 3-0 SH 8-18 (SUTURE) ×4 IMPLANT
TOWEL GREEN STERILE (TOWEL DISPOSABLE) ×2 IMPLANT
TOWEL GREEN STERILE FF (TOWEL DISPOSABLE) ×2 IMPLANT
TRAY FOLEY W/METER SILVER 16FR (SET/KITS/TRAYS/PACK) ×2 IMPLANT
WATER STERILE IRR 1000ML POUR (IV SOLUTION) ×2 IMPLANT

## 2017-04-11 NOTE — Op Note (Signed)
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  Bilateral L4-5 decompressive laminotomies with foraminotomies, more than would be required for simple interbody fusion alone.  L4-5, L5-S1 posterior lumbar interbody fusion utilizing interbody cages and locally harvested autograft  L4-5 and S1 posterior lateral arthrodesis utilizing segmental pedicle screw fixation and local autograft  Surgeon:Lillyahna Hemberger A.Kensley Valladares, M.D.  Asst. Surgeon: Franky Macho  Anesthesia: General  Indication: Patient is a 41 year old female with progressive back and bilateral lower extremity symptoms failing conservative management. Workup demonstrates evidence of a mobile lytic grade 1 L5-S1 spondylolisthesis with marked foraminal stenosis. Patient also with evidence of significant adjacent level disc degeneration and central disc herniation. Patient was now for two-level lumbar decompression and fusion.  Operative note: After induction of anesthesia, patient position prone onto Wilson frame and appropriately padded. Lumbar region prepped and draped sterilely. Incision made overlying L4-S1. Dissection performed bilaterally. Retractor placed. Fluoroscopy used. Levels confirmed. Gill procedures and performed using Publishing rights manager years and a high-speed drill to remove the entire lamina and spinous process of L5 as well as the inferior facets of L5 bilaterally. Entry inferior facet of L5 was also resected completing foraminotomies along the course exiting L5 and S1 nerve roots. Ligament flavum elevated and resected. Bilateral discectomies and performed.  Procedure then repeated with bilateral decompressive laminotomies at L4-5 again without complication. Bilateral discectomies also performed at L4-5. Disc spaces and prepared for interbody fusion. With the distractor placed the patient's right side disc space was then scraped and cleaned of soft tissue on the left side. Starting first at L4-5 an  8 mm standard Med[employer]onic expandable cage was packed with locally harvested autograft and then impacted into place. This is then expanded to its full extent. Distractor removed patient's right side. Disc space prepared on the right side. Morselized autograft packed into the interspace. A second cage packed with autograft was then impacted into place and expanded to its full extent. Procedure then repeated at L5-S1 using 8 mm extra orthotic implants and locally harvested autograft. Pedicles at L4-L5 and S1 were notified using surface landmarks and intraoperative fluoroscopy. Superficial bone overlying the pedicle was then removed using high-speed drill. Each pedicles and probed using a pedicle awl each pedicle awl track was then tapped with a screw tap. Each screw tap hole was probed and found to be solidly within the bone. 5.75 mm radius brand screws from [employer] were placed bilaterally at L4-L5 and S1. Final images revealed good position the cages and the pedicle screws at the proper upper level with normal lamina spine. Wounds and irrigated one final time. Transverse processes and sacroiliac were then decorticated using high-speed drill. Morselized autograft packed posterior laterally for later fusion. Short segment titanium rods and placed over the screw heads at L4-L5 and S1. Locking caps placed over the screw heads. Locking caps and engaged with the construct under mild compression. Wound is then irrigated one final time. Gelfoam was placed topically over the laminotomy defects. She Meissen powder placed the deep wound space. Wounds and close in layers of Vicryl  sutures. Steri-Strips and sterile dressing were applied. No apparent competitions. Patient tolerated the procedure well and she returns to the recovery room postop

## 2017-04-11 NOTE — Brief Op Note (Signed)
04/11/2017  4:02 PM  PATIENT:  Cathy Santos  41 y.o. female  PRE-OPERATIVE DIAGNOSIS:  Spondlylolisthesis  POST-OPERATIVE DIAGNOSIS:  Spondlylolisthesis  PROCEDURE:  Procedure(s): POSTERIOR LUMBAR INTERBODY FUSION  - LUMBAR FOUR-FIVE, LUMBAR FIVE-SACRUM ONE (N/A)  SURGEON:  Surgeon(s) and Role:    * Jeet Shough, Sherilyn Cooter, MD - Primary    * Coletta Memos, MD - Assisting  PHYSICIAN ASSISTANT:   ASSISTANTS:    ANESTHESIA:   general  EBL:  Total I/O In: 2200 [I.V.:2200] Out: 160 [Urine:60; Blood:100]  BLOOD ADMINISTERED:none  DRAINS: none   LOCAL MEDICATIONS USED:  MARCAINE     SPECIMEN:  No Specimen  DISPOSITION OF SPECIMEN:  N/A  COUNTS:  YES  TOURNIQUET:  * No tourniquets in log *  DICTATION: .Dragon Dictation  PLAN OF CARE: Admit to inpatient   PATIENT DISPOSITION:  PACU - hemodynamically stable.   Delay start of Pharmacological VTE agent (>24hrs) due to surgical blood loss or risk of bleeding: yes

## 2017-04-11 NOTE — Anesthesia Preprocedure Evaluation (Addendum)
Anesthesia Evaluation  Patient identified by MRN, date of birth, ID band Patient awake    Reviewed: Allergy & Precautions, H&P , NPO status , Patient's Chart, lab work & pertinent test results  Airway Mallampati: II   Neck ROM: full    Dental  (+) Edentulous Upper, Edentulous Lower, Dental Advisory Given   Pulmonary asthma , Current Smoker,    breath sounds clear to auscultation       Cardiovascular negative cardio ROS   Rhythm:regular Rate:Normal     Neuro/Psych    GI/Hepatic (+) Hepatitis -, C  Endo/Other    Renal/GU      Musculoskeletal  (+) Fibromyalgia -  Abdominal   Peds  Hematology   Anesthesia Other Findings   Reproductive/Obstetrics                            Anesthesia Physical Anesthesia Plan  ASA: II  Anesthesia Plan: General   Post-op Pain Management:    Induction: Intravenous  PONV Risk Score and Plan: 3 and Ondansetron, Dexamethasone, Midazolam and Treatment may vary due to age or medical condition  Airway Management Planned: Oral ETT  Additional Equipment:   Intra-op Plan:   Post-operative Plan: Extubation in OR  Informed Consent: I have reviewed the patients History and Physical, chart, labs and discussed the procedure including the risks, benefits and alternatives for the proposed anesthesia with the patient or authorized representative who has indicated his/her understanding and acceptance.     Plan Discussed with: CRNA, Anesthesiologist and Surgeon  Anesthesia Plan Comments:         Anesthesia Quick Evaluation

## 2017-04-11 NOTE — Anesthesia Procedure Notes (Signed)
Procedure Name: Intubation Date/Time: 04/11/2017 12:47 PM Performed by: Lavell Luster Pre-anesthesia Checklist: Emergency Drugs available, Suction available, Patient identified, Patient being monitored and Timeout performed Patient Re-evaluated:Patient Re-evaluated prior to induction Oxygen Delivery Method: Circle system utilized Preoxygenation: Pre-oxygenation with 100% oxygen Induction Type: IV induction Ventilation: Mask ventilation without difficulty Laryngoscope Size: Mac and 4 Grade View: Grade I Tube type: Oral Tube size: 7.5 mm Number of attempts: 1 Airway Equipment and Method: Stylet Placement Confirmation: ETT inserted through vocal cords under direct vision,  positive ETCO2 and breath sounds checked- equal and bilateral Secured at: 22 cm Tube secured with: Tape Dental Injury: Teeth and Oropharynx as per pre-operative assessment

## 2017-04-11 NOTE — Transfer of Care (Signed)
Immediate Anesthesia Transfer of Care Note  Patient: Cathy Santos  Procedure(s) Performed: Procedure(s): POSTERIOR LUMBAR INTERBODY FUSION  - LUMBAR FOUR-FIVE, LUMBAR FIVE-SACRUM ONE (N/A)  Patient Location: PACU  Anesthesia Type:General  Level of Consciousness: awake, alert  and patient cooperative  Airway & Oxygen Therapy: Patient Spontanous Breathing and Patient connected to nasal cannula oxygen  Post-op Assessment: Report given to RN, Post -op Vital signs reviewed and stable and Patient moving all extremities X 4  Post vital signs: Reviewed and stable  Last Vitals:  Vitals:   04/11/17 1112  BP: 114/75  Pulse: 92  Resp: 18  Temp: 36.8 C  SpO2: 98%    Last Pain:  Vitals:   04/11/17 1131  TempSrc:   PainSc: 8       Patients Stated Pain Goal: 3 (04/11/17 1131)  Complications: No apparent anesthesia complications

## 2017-04-11 NOTE — H&P (Signed)
Cathy Santos is an 41 y.o. female.   Chief Complaint: back pain HPI: 41 year old female with progressive back pain with radiation of both lower extremities. Workup demonstrates evidence of a grade 1 L5-S1 progressive lytic spondylolisthesis with marked foraminal stenosis. Patient also with evidence of significant disc space degeneration and central annular tearing at L4-5. Patient has failed conservative management and presents now for two-level lumbar decompression and fusion.  Past Medical History:  Diagnosis Date  . Asthma   . Fibromyalgia   . Hep C w/o coma, chronic (HCC)   . Lumbar back pain   . Spondylolisthesis of lumbar region   . Ulcer     Past Surgical History:  Procedure Laterality Date  . CHOLECYSTECTOMY  07/03/2011   Procedure: LAPAROSCOPIC CHOLECYSTECTOMY;  Surgeon: Jetty Duhamel, MD;  Location: [facility] OR;  Service: General;  Laterality: N/A;  . ERCP  07/02/2011   Procedure: ENDOSCOPIC RETROGRADE CHOLANGIOPANCREATOGRAPHY (ERCP);  Surgeon: Iva Boop, MD;  Location: [facility] OR;  Service: Gastroenterology;  Laterality: N/A;  . TUBAL LIGATION      Family History  Problem Relation Age of Onset  . Cancer Mother   . Diabetes Father   . Heart failure Father   . Hypertension Father   . Stroke Father    Social History:  reports that she has been smoking Cigarettes.  She has been smoking about 1.00 pack per day. She has never used smokeless tobacco. She reports that she does not drink alcohol or use drugs.  Allergies:  Allergies  Allergen Reactions  . Amoxicillin Nausea And Vomiting  . Hydrocodone Itching and Nausea And Vomiting    Medications Prior to Admission  Medication Sig Dispense Refill  . albuterol (PROVENTIL HFA;VENTOLIN HFA) 108 (90 BASE) MCG/ACT inhaler Inhale 2 puffs into the lungs every 6 (six) hours as needed for wheezing or shortness of breath.    . ALPRAZolam (XANAX) 0.5 MG tablet Take 0.5 mg by mouth 2 (two) times daily.    Marland Kitchen buPROPion (WELLBUTRIN  XL) 150 MG 24 hr tablet Take 150 mg by mouth every morning.  1  . gabapentin (NEURONTIN) 600 MG tablet Take 600 mg by mouth 4 (four) times daily.   2  . ibuprofen (ADVIL,MOTRIN) 200 MG tablet Take 200 mg by mouth every 6 (six) hours as needed for headache.    . ondansetron (ZOFRAN-ODT) 4 MG disintegrating tablet Take 4 mg by mouth as needed. FOR NAUSEA/VOMITING  2  . oxyCODONE-acetaminophen (PERCOCET/ROXICET) 5-325 MG tablet Take 1 tablet by mouth every 6 (six) hours as needed for pain.  0  . QUEtiapine (SEROQUEL) 100 MG tablet Take 200 mg by mouth at bedtime.  1  . Diclofenac Sodium 3 % GEL Apply 2 g topically 2 (two) times daily.  2    No results found for this or any previous visit (from the past 48 hour(s)). No results found.  Pertinent items noted in HPI and remainder of comprehensive ROS otherwise negative.  Blood pressure 114/75, pulse 92, temperature 98.2 F (36.8 C), temperature source Oral, resp. rate 18, height 5\' 5"  (1.651 m), weight 75.8 kg (167 lb), last menstrual period 02/07/2017, SpO2 98 %.  Patient is awake and alert. She is oriented and appropriate. Speech is fluent. Judgment and insight are intact. Cranial nerve function normal bilaterally. Motor examination intact bilaterally sensory examination with decreased sensation to pinprick and light touch in both L5 dermatomes. Deep tendon versus normal active. No evidence of long track signs. Gait somewhat antalgic. Posterior  mildly flexed. Examination head ears eyes nose and throat is unremarkable. Chest and abdomen are benign. Extremities are free from injury deformity. Assessment/Plan L5-S1 lytic spondylolisthesis with severe foraminal stenosis and back pain with radiculopathy, L4-5 degenerative disc disease with central annular disruption. Plan bilateral L4-5 and L5-S1 decompressive laminotomies and foraminotomies followed by posterior lumbar interbody fusion utilizing interbody cages, locally harvested autograft, and augmented  with posterior lateral arthrodesis utilizing segmental pedicle screw fixation and local autografting. Risks and benefits been explained. Patient wishes to proceed.  Mao Lockner A 04/11/2017, 11:59 AM

## 2017-04-12 MED ORDER — ALUM & MAG HYDROXIDE-SIMETH 200-200-20 MG/5ML PO SUSP
30.0000 mL | Freq: Four times a day (QID) | ORAL | Status: DC | PRN
Start: 2017-04-12 — End: 2017-04-14
  Administered 2017-04-12: 30 mL via ORAL
  Filled 2017-04-12: qty 30

## 2017-04-12 MED ORDER — HYDROMORPHONE HCL 1 MG/ML IJ SOLN
0.5000 mg | INTRAMUSCULAR | Status: DC | PRN
  Administered 2017-04-12 – 2017-04-13 (×4): 0.5 mg via INTRAVENOUS
  Filled 2017-04-12 (×5): qty 0.5

## 2017-04-12 MED ORDER — NICOTINE 21 MG/24HR TD PT24
21.0000 mg | MEDICATED_PATCH | Freq: Every day | TRANSDERMAL | Status: DC
  Administered 2017-04-12 – 2017-04-14 (×3): 21 mg via TRANSDERMAL
  Filled 2017-04-12 (×3): qty 1

## 2017-04-12 NOTE — Progress Notes (Signed)
OT Note - Addendum    04/12/17 1330  OT Visit Information  Last OT Received On 04/12/17  OT Time Calculation  OT Start Time (ACUTE ONLY) 0952  OT Stop Time (ACUTE ONLY) 1027  OT Time Calculation (min) 35 min  OT General Charges  $OT Visit 1 Visit  OT Evaluation  $OT Eval Moderate Complexity 1 Mod  OT Treatments  $Self Care/Home Management  8-22 mins  [facility], OT/L  [phone] 04/12/2017

## 2017-04-12 NOTE — Progress Notes (Signed)
Postop day 1. Patient complains of back pain. No lower extremity pain.  Afebrile vital signs are stable. Awake and alert. Motor and sensory intact. Abdomen soft. Wound clean and dry.  Overall doing well following lumbar decompression and fusion. Continue efforts at mobilization.

## 2017-04-12 NOTE — Evaluation (Signed)
Physical Therapy Evaluation Patient Details Name: Cathy Santos MRN: [id number] DOB: [birth year] Today's Date: 04/12/2017   History of Present Illness  Pt is a 41 y.o. female s/p L5-S1 Gill procedure with bilateral L5-S1 decompressive foraminotomies. Bilateral L4-5 decompressive laminotomies with foraminotomies. L4-5, L5-S1 posterior lumbar interbody fusion utilizing interbody cages and locally harvested autograft. L4-5 and S1 posterior lateral arthrodesis utilizing segmental pedicle screw fixation and local autograft. PMH significant for asthma, fibromyalgia, hep c w/o coma, lumabr back pain, spondyloisthesis and ulcer.   Clinical Impression  Patient is s/p above surgery resulting in the deficits listed below (see PT Problem List). Pt limited by pain but did participate in ambulation this date. Patient will benefit from skilled PT to increase their independence and safety with mobility (while adhering to their precautions) to allow discharge to the venue listed below.     Follow Up Recommendations No PT follow up;Supervision - Intermittent    Equipment Recommendations  None recommended by PT    Recommendations for Other Services       Precautions / Restrictions Precautions Precautions: Back Precaution Booklet Issued: Yes (comment) Precaution Comments: pt able to recall 3/3 precautions Required Braces or Orthoses: Spinal Brace Spinal Brace: Lumbar corset Restrictions Weight Bearing Restrictions: No      Mobility  Bed Mobility Overal bed mobility: Needs Assistance Bed Mobility: Rolling;Sidelying to Sit Rolling: Min assist Sidelying to sit: Min assist       General bed mobility comments: increased time but able to complete without physical assist  Transfers Overall transfer level: Needs assistance Equipment used: 1 person hand held assist Transfers: Sit to/from Stand Sit to Stand: Min guard         General transfer comment: v/c's for hand placement, guarded, no  physical assist  Ambulation/Gait Ambulation/Gait assistance: Min guard;Supervision Ambulation Distance (Feet): 100 Feet Assistive device: None Gait Pattern/deviations: Step-through pattern;Decreased stride length Gait velocity: guarded Gait velocity interpretation: Below normal speed for age/gender General Gait Details: slow, guarded, v/c's to contract abdominal muscles to help support back  Stairs            Wheelchair Mobility    Modified Rankin (Stroke Patients Only)       Balance Overall balance assessment: Needs assistance Sitting-balance support: No upper extremity supported;Feet supported Sitting balance-Leahy Scale: Good Sitting balance - Comments: pt able to don brace without difficulty   Standing balance support: During functional activity;No upper extremity supported Standing balance-Leahy Scale: Good Standing balance comment: Single extremity suppported during dynamic standing. No UE support during static standing to wash hands.                             Pertinent Vitals/Pain Pain Assessment: 0-10 Pain Score: 7  Pain Location: Back Pain Descriptors / Indicators: Aching;Sore Pain Intervention(s): Monitored during session    Home Living Family/patient expects to be discharged to:: Private residence Living Arrangements: Spouse/significant other;Children Available Help at Discharge: Family;Friend(s) Type of Home: House Home Access: Stairs to enter Entrance Stairs-Rails: None Entrance Stairs-Number of Steps: 5 Home Layout: One level Home Equipment: Cane - single point;Bedside commode;Shower seat;Hospital bed;Walker - 2 wheels      Prior Function Level of Independence: Independent with assistive device(s)         Comments: Use of a cane for all mobility.     Hand Dominance        Extremity/Trunk Assessment   Upper Extremity Assessment Upper Extremity Assessment: Overall WFL for tasks  assessed    Lower Extremity  Assessment Lower Extremity Assessment: Generalized weakness    Cervical / Trunk Assessment Cervical / Trunk Assessment: Other exceptions Cervical / Trunk Exceptions: s/p back surgery  Communication   Communication: No difficulties  Cognition Arousal/Alertness: Awake/alert Behavior During Therapy: WFL for tasks assessed/performed Overall Cognitive Status: Within Functional Limits for tasks assessed                                 General Comments: Pt very fearful       General Comments General comments (skin integrity, edema, etc.): Pt very fearful and in a lot of pain. Pt very cautious of precautions and verbalizes understanding.     Exercises     Assessment/Plan    PT Assessment Patient needs continued PT services  PT Problem List Decreased strength;Decreased balance;Decreased activity tolerance;Decreased mobility;Decreased coordination       PT Treatment Interventions DME instruction;Gait training;Stair training;Functional mobility training;Therapeutic activities;Therapeutic exercise;Balance training    PT Goals (Current goals can be found in the Care Plan section)  Acute Rehab PT Goals Patient Stated Goal: to get my life back PT Goal Formulation: With patient Time For Goal Achievement: 04/19/17 Potential to Achieve Goals: Good    Frequency Min 5X/week   Barriers to discharge        Co-evaluation               AM-PAC PT "6 Clicks" Daily Activity  Outcome Measure Difficulty turning over in bed (including adjusting bedclothes, sheets and blankets)?: A Little Difficulty moving from lying on back to sitting on the side of the bed? : A Little Difficulty sitting down on and standing up from a chair with arms (e.g., wheelchair, bedside commode, etc,.)?: A Little Help needed moving to and from a bed to chair (including a wheelchair)?: A Little Help needed walking in hospital room?: A Little Help needed climbing 3-5 steps with a railing? : A Lot 6  Click Score: 17    End of Session Equipment Utilized During Treatment: Gait belt;Back brace Activity Tolerance: Patient tolerated treatment well Patient left: in chair;with call bell/phone within reach;with family/visitor present Nurse Communication: Mobility status PT Visit Diagnosis: Difficulty in walking, not elsewhere classified (R26.2);Pain Pain - part of body:  (back)    Time: 1209-1229 PT Time Calculation (min) (ACUTE ONLY): 20 min   Charges:   PT Evaluation $PT Eval Moderate Complexity: 1 Mod     PT G Codes:        Lewis Shock, PT, DPT Pager #: [phone] Office #: [phone]   Tome Wilson M Kim Lauver 04/12/2017, 1:29 PM

## 2017-04-12 NOTE — Therapy (Signed)
Occupational Therapy Evaluation Patient Details Name: Cathy Santos MRN: [id number] DOB: 1976/05/29 Today's Date: 04/12/2017    History of Present Illness Pt is a 41 y.o. female s/p L5-S1 Gill procedure with bilateral L5-S1 decompressive foraminotomies. Bilateral L4-5 decompressive laminotomies with foraminotomies. L4-5, L5-S1 posterior lumbar interbody fusion utilizing interbody cages and locally harvested autograft. L4-5 and S1 posterior lateral arthrodesis utilizing segmental pedicle screw fixation and local autograft. PMH significant for asthma, fibromyalgia, hep c w/o coma, lumabr back pain, spondyloisthesis and ulcer.    Clinical ImpressionDictation #1 [id number]  [id number]    Pt reports being independent with ADLs and IADLs with the use of a cane in the home and community PTA. Pt currently requires min assist for LB ADLs and toilet hygiene and min assist for functional mobility. Pt reports family is able to provide 24/7 supervision and assistance as needed. OT will follow acutely to address established goals.     Follow Up Recommendations  No OT follow up;Supervision/Assistance - 24 hour    Equipment Recommendations  Tub/shower bench (Pt reports having 3in1 and hospital bed at home. )    Recommendations for Other Services       Precautions / Restrictions Precautions Precautions: Back Precaution Booklet Issued: Yes (comment) Precaution Comments: Pt educated on back precautions. Required Braces or Orthoses: Spinal Brace Spinal Brace: Lumbar corset Restrictions Weight Bearing Restrictions: No      Mobility Bed Mobility Overal bed mobility: Needs Assistance Bed Mobility: Rolling;Sidelying to Sit Rolling: Min assist Sidelying to sit: Min assist       General bed mobility comments: Educated on log rolling technique. Min assist for legs  Transfers Overall transfer level: Needs assistance Equipment used: 1 person hand held assist Transfers: Sit to/from  Stand Sit to Stand: Min guard         General transfer comment: No physical assist required. Min guard for safety. Increased time and effort due to pain.    Balance Overall balance assessment: Needs assistance Sitting-balance support: No upper extremity supported;Feet supported Sitting balance-Leahy Scale: Good Sitting balance - Comments: Pt able to sit EOB with no UE support.    Standing balance support: Single extremity supported;During functional activity Standing balance-Leahy Scale: Good Standing balance comment: Single extremity suppported during dynamic standing. No UE support during static standing to wash hands.                           ADL either performed or assessed with clinical judgement   ADL Overall ADL's : Needs assistance/impaired     Grooming: Oral care;Wash/dry hands;Min guard;Cueing for safety;Cueing for compensatory techniques;Standing Grooming Details (indicate cue type and reason): Pt educated on compensatory techniques for completing oral care with back precautions. Upper Body Bathing: Supervision/ safety;Set up;Cueing for safety;Cueing for compensatory techniques;Sitting   Lower Body Bathing: Minimal assistance;Cueing for compensatory techniques;Cueing for back precautions;Adhering to back precautions;Sit to/from stand Lower Body Bathing Details (indicate cue type and reason): Pt educated on compensatory technique. Requires min assist initially  Upper Body Dressing : Supervision/safety;Set up;Cueing for safety;Cueing for compensatory techniques;Sitting   Lower Body Dressing: Minimal assistance;Cueing for safety;Cueing for compensatory techniques;Cueing for back precautions;Adhering to back precautions;Sit to/from stand Lower Body Dressing Details (indicate cue type and reason): Pt educated on compensatory technique. Requires min assist initially  Toilet Transfer: Min guard;Cueing for safety;Cueing for sequencing;Ambulation;Comfort height  toilet Toilet Transfer Details (indicate cue type and reason): Pt educated on compensatory technique to transfer on and off with back  precautions. Increased time and effort due to pain.  Toileting- Clothing Manipulation and Hygiene: Minimal assistance;Cueing for safety;Cueing for compensatory techniques;Cueing for back precautions;Adhering to back precautions;Sit to/from stand Toileting - Clothing Manipulation Details (indicate cue type and reason): Pt educated on AE to assist with hygiene. Requires min assist initally.  Tub/ Shower Transfer: Tub transfer;Minimal assistance;Adhering to back precautions;Cueing for safety;Cueing for sequencing;Ambulation;Shower seat   Functional mobility during ADLs: Minimal assistance General ADL Comments: Pt educated on compensatory techniques. Pt unable to bring foot over knee to complete LB ADLs. May benefit from AE.      Vision         Perception     Praxis      Pertinent Vitals/Pain Pain Assessment: 0-10 Pain Score: 8  Pain Location: Back Pain Descriptors / Indicators: Aching;Sore Pain Intervention(s): Limited activity within patient's tolerance;Monitored during session, premedicated      Hand Dominance     Extremity/Trunk Assessment Upper Extremity Assessment Upper Extremity Assessment: Overall WFL for tasks assessed   Lower Extremity Assessment Lower Extremity Assessment: Defer to PT evaluation   Cervical / Trunk Assessment Cervical / Trunk Assessment: Other exceptions Cervical / Trunk Exceptions: s/p back surgery   Communication Communication Communication: No difficulties   Cognition Arousal/Alertness: Awake/alert Behavior During Therapy: WFL for tasks assessed/performed, anxious Overall Cognitive Status: Within Functional Limits for tasks assessed                                 General Comments: Pt very fearful    General Comments  Pt very fearful and in a lot of pain. Pt very cautious of precautions and  verbalizes understanding.     Exercises     Shoulder Instructions      Home Living Family/patient expects to be discharged to:: Private residence Living Arrangements: Spouse/significant other;Children Available Help at Discharge: Family;Friend(s) Type of Home: House Home Access: Stairs to enter [employer] of Steps: 5 Entrance Stairs-Rails: None Home Layout: One level     Bathroom Shower/Tub: Chief Strategy Officer: Standard Bathroom Accessibility: Yes How Accessible: Accessible via walker Home Equipment: Cane - single point;Bedside commode;Shower seat;Hospital bed;Walker - 2 wheels          Prior Functioning/Environment Level of Independence: Independent with assistive device(s)        Comments: Use of a cane for all mobility.        OT Problem List: Decreased range of motion;Impaired balance (sitting and/or standing);Decreased knowledge of use of DME or AE;Decreased knowledge of precautions;Pain      OT Treatment/Interventions: Self-care/ADL training;DME and/or AE instruction;Therapeutic activities;Patient/family education;Balance training    OT Goals(Current goals can be found in the care plan section) Acute Rehab OT Goals Patient Stated Goal: To manage pain OT Goal Formulation: With patient Time For Goal Achievement: 04/19/17 Potential to Achieve Goals: Good ADL Goals Pt Will Perform Lower Body Bathing: sit to/from stand;with supervision;with caregiver independent in assisting (with AE as needed.) Pt Will Perform Lower Body Dressing: sit to/from stand;with supervision (with AE as needed.) Pt Will Perform Tub/Shower Transfer: Tub transfer;with supervision;tub bench;ambulating  OT Frequency: Min 2X/week   Barriers to D/C:            Co-evaluation              AM-PAC PT "6 Clicks" Daily Activity     Outcome Measure Help from another person eating meals?: None Help from another person taking  care of personal grooming?:  None Help from another person toileting, which includes using toliet, bedpan, or urinal?: A Little Help from another person bathing (including washing, rinsing, drying)?: A Little Help from another person to put on and taking off regular upper body clothing?: A Little Help from another person to put on and taking off regular lower body clothing?: A Little 6 Click Score: 20   End of Session Equipment Utilized During Treatment: Gait belt;Back brace Nurse Communication: Mobility status  Activity Tolerance: Patient tolerated treatment well Patient left: in chair;with call bell/phone within reach;with family/visitor present  OT Visit Diagnosis: Pain;Unsteadiness on feet (R26.81) Pain - part of body:  (Back)                Time: 1761-6073 OT Time Calculation (min): 35 min Charges:    G-Codes:     Cammy Copa, OTS [phone]   Cammy Copa  Read and agree with above. [facility], OT/L  [phone] 04/12/2017 04/12/2017, 11:13 AM

## 2017-04-13 MED ORDER — ACETAMINOPHEN 500 MG PO TABS
1000.0000 mg | ORAL_TABLET | Freq: Four times a day (QID) | ORAL | Status: DC | PRN
  Administered 2017-04-13 – 2017-04-14 (×2): 1000 mg via ORAL
  Filled 2017-04-13 (×2): qty 2

## 2017-04-13 MED ORDER — HYDROMORPHONE HCL 2 MG PO TABS
2.0000 mg | ORAL_TABLET | ORAL | Status: DC | PRN
  Administered 2017-04-13 (×4): 4 mg via ORAL
  Administered 2017-04-14 (×2): 2 mg via ORAL
  Filled 2017-04-13 (×2): qty 2
  Filled 2017-04-13: qty 1
  Filled 2017-04-13 (×2): qty 2
  Filled 2017-04-13: qty 1
  Filled 2017-04-13: qty 2

## 2017-04-13 MED FILL — Heparin Sodium (Porcine) Inj 1000 Unit/ML: INTRAMUSCULAR | Qty: 30 | Status: AC

## 2017-04-13 MED FILL — Sodium Chloride IV Soln 0.9%: INTRAVENOUS | Qty: 1000 | Status: AC

## 2017-04-13 NOTE — Progress Notes (Signed)
Postop day 2. Still with quite a bit of back pain. No lower M.D. pain. Walking with therapy but pain control still problematic.  Afebrile. Vitals are stable. Awake and alert. Oriented and appropriate. Motor sensory function intact. Wound clean and dry.  Progressing slowly. Will change pain medication around a little bit. Hopeful discharge tomorrow.

## 2017-04-13 NOTE — Progress Notes (Signed)
Physical Therapy Treatment Patient Details Name: Cathy Santos MRN: [id number] DOB: 01/20/1976 Today's Date: 04/13/2017    History of Present Illness Pt is a 41 y.o. female s/p L5-S1 Gill procedure with bilateral L5-S1 decompressive foraminotomies. Bilateral L4-5 decompressive laminotomies with foraminotomies. L4-5, L5-S1 posterior lumbar interbody fusion utilizing interbody cages and locally harvested autograft. L4-5 and S1 posterior lateral arthrodesis utilizing segmental pedicle screw fixation and local autograft. PMH significant for asthma, fibromyalgia, hep c w/o coma, lumabr back pain, spondyloisthesis and ulcer.     PT Comments    Pt making good progress with mobility and successfully completed stair training this session. PT will continue to follow acutely to ensure a safe d/c home.   Follow Up Recommendations  No PT follow up;Supervision - Intermittent     Equipment Recommendations  None recommended by PT    Recommendations for Other Services       Precautions / Restrictions Precautions Precautions: Back Precaution Comments: pt able to recall 3/3 precautions Required Braces or Orthoses: Spinal Brace Spinal Brace: Lumbar corset Restrictions Weight Bearing Restrictions: No    Mobility  Bed Mobility               General bed mobility comments: pt OOB in recliner upon arrival  Transfers Overall transfer level: Needs assistance Equipment used: None Transfers: Sit to/from Stand Sit to Stand: Min guard         General transfer comment: v/c's for hand placement, guarded, no physical assist  Ambulation/Gait Ambulation/Gait assistance: Min guard Ambulation Distance (Feet): 200 Feet Assistive device: None Gait Pattern/deviations: Step-through pattern;Decreased stride length Gait velocity: decreased Gait velocity interpretation: Below normal speed for age/gender General Gait Details: slow, cautious gait pattern, mild instability but no overt LOB or need for  physical assistance   Stairs Stairs: Yes   Stair Management: One rail Left;Step to pattern;Forwards Number of Stairs: 4 General stair comments: min guard for safety  Wheelchair Mobility    Modified Rankin (Stroke Patients Only)       Balance Overall balance assessment: Needs assistance Sitting-balance support: No upper extremity supported;Feet supported Sitting balance-Leahy Scale: Good     Standing balance support: During functional activity;No upper extremity supported Standing balance-Leahy Scale: Fair                              Cognition Arousal/Alertness: Awake/alert Behavior During Therapy: WFL for tasks assessed/performed Overall Cognitive Status: Within Functional Limits for tasks assessed                                        Exercises      General Comments        Pertinent Vitals/Pain Pain Assessment: 0-10 Pain Score: 7  Pain Location: Back Pain Descriptors / Indicators: Aching;Sore Pain Intervention(s): Monitored during session;Repositioned    Home Living                      Prior Function            PT Goals (current goals can now be found in the care plan section) Acute Rehab PT Goals PT Goal Formulation: With patient Time For Goal Achievement: 04/19/17 Potential to Achieve Goals: Good Progress towards PT goals: Progressing toward goals    Frequency    Min 5X/week      PT Plan Current plan  remains appropriate    Co-evaluation              AM-PAC PT "6 Clicks" Daily Activity  Outcome Measure  Difficulty turning over in bed (including adjusting bedclothes, sheets and blankets)?: A Little Difficulty moving from lying on back to sitting on the side of the bed? : A Little Difficulty sitting down on and standing up from a chair with arms (e.g., wheelchair, bedside commode, etc,.)?: A Little Help needed moving to and from a bed to chair (including a wheelchair)?: A Little Help needed  walking in hospital room?: A Little Help needed climbing 3-5 steps with a railing? : A Little 6 Click Score: 18    End of Session Equipment Utilized During Treatment: Gait belt;Back brace Activity Tolerance: Patient tolerated treatment well Patient left: in chair;with call bell/phone within reach Nurse Communication: Mobility status PT Visit Diagnosis: Difficulty in walking, not elsewhere classified (R26.2);Pain Pain - part of body:  (back)     Time: [id number] PT Time Calculation (min) (ACUTE ONLY): 16 min  Charges:  $Gait Training: 8-22 mins                    G Codes:       Cathy Santos, [place], [place] [phone]    Cathy Santos 04/13/2017, 8:48 AM

## 2017-04-13 NOTE — Therapy (Signed)
Occupational Therapy Treatment Patient Details Name: Cathy Santos MRN: [id number] DOB: 02-06-1976 Today's Date: 04/13/2017    History of present illness Pt is a 41 y.o. female s/p L5-S1 Gill procedure with bilateral L5-S1 decompressive foraminotomies. Bilateral L4-5 decompressive laminotomies with foraminotomies. L4-5, L5-S1 posterior lumbar interbody fusion utilizing interbody cages and locally harvested autograft. L4-5 and S1 posterior lateral arthrodesis utilizing segmental pedicle screw fixation and local autograft. PMH significant for asthma, fibromyalgia, hep c w/o coma, lumabr back pain, spondyloisthesis and ulcer.    OT comments  Focus of today's session on AE education to increase independence with ADLs. Pt required min guard for LB ADLs and toilet transfer/hygiene due to safety. Pt requires min assist and increased time during functional mobility due to decreased balance and pain. Will continue to follow acutely to address established goals.    Follow Up Recommendations  No OT follow up;Supervision/Assistance - 24 hour    Equipment Recommendations  Tub/shower bench    Recommendations for Other Services      Precautions / Restrictions Precautions Precautions: Back Precaution Comments: pt able to recall 3/3 precautions Required Braces or Orthoses: Spinal Brace Spinal Brace: Lumbar corset Restrictions Weight Bearing Restrictions: No       Mobility Bed Mobility Overal bed mobility: Needs Assistance Bed Mobility: Rolling;Sidelying to Sit;Sit to Sidelying Rolling: Min guard Sidelying to sit: Min guard     Sit to sidelying: Min guard General bed mobility comments: Verbal cues for technique.  Transfers Overall transfer level: Needs assistance Equipment used: None Transfers: Sit to/from Stand Sit to Stand: Min guard         General transfer comment: no physical assist. min guard for safety.    Balance Overall balance assessment: Needs  assistance Sitting-balance support: No upper extremity supported;Feet supported Sitting balance-Leahy Scale: Good Sitting balance - Comments: pt able to don brace without difficulty   Standing balance support: No upper extremity supported;During functional activity Standing balance-Leahy Scale: Fair                             ADL either performed or assessed with clinical judgement   ADL Overall ADL's : Needs assistance/impaired     Grooming: Supervision/safety;Wash/dry hands;Cueing for safety;Standing   Upper Body Bathing: Min guard;Cueing for safety;Cueing for compensatory techniques;Cueing for UE precautions;Sitting   Lower Body Bathing: Min guard;Cueing for safety;Cueing for compensatory techniques;Cueing for back precautions;Sitting/lateral leans Lower Body Bathing Details (indicate cue type and reason): pt educated on use of long handled sponge to complete LB dressing with back precautions.     Lower Body Dressing: Min guard;Cueing for safety;Cueing for compensatory techniques;Cueing for back precautions;Sit to/from stand Lower Body Dressing Details (indicate cue type and reason): pt educated on use of reacher and sock aide to don socks. Pt able to don socks with sock aid with min guard for safety. Toilet Transfer: Min guard;Cueing for safety;Cueing for sequencing;Ambulation;Comfort height toilet;Grab bars;RW Statistician Details (indicate cue type and reason): Increased time due to pain  Toileting- Clothing Manipulation and Hygiene: Min guard;Cueing for safety;Cueing for compensatory techniques;Cueing for back precautions;Sitting/lateral lean Toileting - Clothing Manipulation Details (indicate cue type and reason): Pt educated on use of tongs to complete hygiene after BM with back precautions.     Functional mobility during ADLs: Minimal assistance General ADL Comments: Pt educated on use of AE to increase independence with ADLs while adering to back precautions.  Overall pt requires min guard for safety and decreased  balance during ADLs.      Vision       Perception     Praxis      Cognition Arousal/Alertness: Awake/alert Behavior During Therapy: WFL for tasks assessed/performed Overall Cognitive Status: Within Functional Limits for tasks assessed                                          Exercises     Shoulder Instructions       General Comments Pt tearful about not being discharged today.     Pertinent Vitals/ Pain       Pain Assessment: 0-10 Pain Score: 7  Pain Location: Back Pain Descriptors / Indicators: Aching;Sore Pain Intervention(s): Limited activity within patient's tolerance;Monitored during session  Home Living                                          Prior Functioning/Environment              Frequency  Min 2X/week        Progress Toward Goals  OT Goals(current goals can now be found in the care plan section)  Progress towards OT goals: Progressing toward goals  Acute Rehab OT Goals Patient Stated Goal: To go home OT Goal Formulation: With patient Time For Goal Achievement: 04/19/17 Potential to Achieve Goals: Good  Plan Discharge plan remains appropriate    Co-evaluation                 AM-PAC PT "6 Clicks" Daily Activity     Outcome Measure   Help from another person eating meals?: None Help from another person taking care of personal grooming?: None Help from another person toileting, which includes using toliet, bedpan, or urinal?: None Help from another person bathing (including washing, rinsing, drying)?: A Little Help from another person to put on and taking off regular upper body clothing?: None Help from another person to put on and taking off regular lower body clothing?: A Little 6 Click Score: 22    End of Session Equipment Utilized During Treatment: Gait belt  OT Visit Diagnosis: Pain;Unsteadiness on feet (R26.81) Pain - part of  body:  (Back)   Activity Tolerance Patient tolerated treatment well   Patient Left in bed;with call bell/phone within reach   Nurse Communication Mobility status        Time: [id number] OT Time Calculation (min): 30 min  Charges:    Cammy Copaourtney Deivi Huckins, OTS [phone]   Cammy Copaourtney [place] 04/13/2017, 12:36 PM

## 2017-04-14 MED ORDER — HYDROMORPHONE HCL 2 MG PO TABS: 2.0000 mg | ORAL_TABLET | ORAL | 0 refills | Status: AC | PRN

## 2017-04-14 MED ORDER — DIAZEPAM 5 MG PO TABS: 5.0000 mg | ORAL_TABLET | Freq: Four times a day (QID) | ORAL | 0 refills | Status: AC | PRN

## 2017-04-14 NOTE — Progress Notes (Signed)
Occupational Therapy Treatment Patient Details Name: Cathy Santos MRN: [id number] DOB: 1976/03/27 Today's Date: 04/14/2017    History of present illness Pt is a 41 y.o. female s/p L5-S1 Gill procedure with bilateral L5-S1 decompressive foraminotomies. Bilateral L4-5 decompressive laminotomies with foraminotomies. L4-5, L5-S1 posterior lumbar interbody fusion utilizing interbody cages and locally harvested autograft. L4-5 and S1 posterior lateral arthrodesis utilizing segmental pedicle screw fixation and local autograft. PMH significant for asthma, fibromyalgia, hep c w/o coma, lumabr back pain, spondyloisthesis and ulcer.    OT comments  Pt able to perform toilet transfer today with supervision for safety. Pt educated on tub transfer technique with use of tub bench. Provided pt with AE kit and reviewed use for home. D/c plan remains appropriate. Will continue to follow acutely.   Follow Up Recommendations  No OT follow up;Supervision/Assistance - 24 hour    Equipment Recommendations  Tub/shower bench    Recommendations for Other Services      Precautions / Restrictions Precautions Precautions: Back Precaution Comments: Pt able to verbally recall 2/3 back precautions. Reviewed all with pt. Required Braces or Orthoses: Spinal Brace Spinal Brace: Lumbar corset Restrictions Weight Bearing Restrictions: No       Mobility Bed Mobility               General bed mobility comments: Pt OOB in chair upon arrival  Transfers Overall transfer level: Needs assistance Equipment used: None Transfers: Sit to/from Stand Sit to Stand: Supervision         General transfer comment: supervision for safety; able to maintain back precautions thorughout    Balance Overall balance assessment: Needs assistance Sitting-balance support: Feet supported;No upper extremity supported Sitting balance-Leahy Scale: Good     Standing balance support: No upper extremity supported;During  functional activity Standing balance-Leahy Scale: Fair                             ADL either performed or assessed with clinical judgement   ADL Overall ADL's : Needs assistance/impaired     Grooming: Supervision/safety;Wash/dry hands;Standing           Upper Body Dressing : Set up;Supervision/safety;Sitting Upper Body Dressing Details (indicate cue type and reason): to don brace Lower Body Dressing: Maximal assistance Lower Body Dressing Details (indicate cue type and reason): to don socks Toilet Transfer: Supervision/safety;Ambulation;BSC   Toileting- Water quality scientist and Hygiene: Supervision/safety;Sit to/from Nurse, children's Details (indicate cue type and reason): Educated pt on use of tub bench for transfers Functional mobility during ADLs: Supervision/safety General ADL Comments: Reviewed use of AE and provided kit to pt for home use.     Vision       Perception     Praxis      Cognition Arousal/Alertness: Awake/alert Behavior During Therapy: WFL for tasks assessed/performed Overall Cognitive Status: Within Functional Limits for tasks assessed                                          Exercises     Shoulder Instructions       General Comments      Pertinent Vitals/ Pain       Pain Assessment: Faces Faces Pain Scale: Hurts even more Pain Location: back Pain Descriptors / Indicators: Aching;Sore Pain Intervention(s): Monitored during session  Home Living  Prior Functioning/Environment              Frequency  Min 2X/week        Progress Toward Goals  OT Goals(current goals can now be found in the care plan section)  Progress towards OT goals: Progressing toward goals  Acute Rehab OT Goals Patient Stated Goal: home today OT Goal Formulation: With patient/family  Plan Discharge plan remains appropriate    Co-evaluation                  AM-PAC PT "6 Clicks" Daily Activity     Outcome Measure   Help from another person eating meals?: None Help from another person taking care of personal grooming?: A Little Help from another person toileting, which includes using toliet, bedpan, or urinal?: A Little Help from another person bathing (including washing, rinsing, drying)?: A Little Help from another person to put on and taking off regular upper body clothing?: A Lot Help from another person to put on and taking off regular lower body clothing?: A Little 6 Click Score: 18    End of Session Equipment Utilized During Treatment: Back brace  OT Visit Diagnosis: Pain;Unsteadiness on feet (R26.81) Pain - part of body:  (back)   Activity Tolerance Patient tolerated treatment well   Patient Left in chair;with call bell/phone within reach;with family/visitor present   Nurse Communication          Time: 2258-3462 OT Time Calculation (min): 15 min  Charges: OT General Charges $OT Visit: 1 Visit OT Treatments $Self Care/Home Management : 8-22 mins  Kairah Leoni A. Ulice Brilliant, M.S., OTR/L Pager: [place] 04/14/2017, 10:02 AM

## 2017-04-14 NOTE — Progress Notes (Signed)
Pt doing well. Pt and husband given D/C instructions with Rx's, verbal understanding was provided. Pt's incision is clean and dry with no sign of infection. Pt's IV was removed prior to D/C. Pt D/C'd home via wheelchair @ 1000 per MD order. Pt is stable @ D/C and has no other needs at this time. Jerre Vandrunen, RN  

## 2017-04-14 NOTE — Discharge Instructions (Signed)

## 2017-04-14 NOTE — Discharge Summary (Signed)
Physician Discharge Summary  Patient ID: Cathy PoeBarbara E Santos MRN: [id number] DOB/AGE: 41-May-1977 41 y.o.  Admit date: 04/11/2017 Discharge date: 04/14/2017  Admission Diagnoses:  Discharge Diagnoses:  Active Problems:   Spondylolisthesis at L5-S1 level   Discharged Condition: good  Hospital Course: Patient admitted to the hospital where she underwent an uncomplicated two-level lumbar decompression and fusion. Postoperatively she is done reasonably well. Patient with significant incisional pain but no lower extremity pain. Ambulating without difficulty. Ready for discharge home.  Consults:   Significant Diagnostic Studies:   Treatments:   Discharge Exam: Blood pressure 117/75, pulse (!) 118, temperature (!) 100.6 F (38.1 C), resp. rate 18, height 5\' 5"  (1.651 m), weight 75.8 kg (167 lb), last menstrual period 02/07/2017, SpO2 100 %. Awake and alert. Oriented and appropriate. Cranial nerve function intact. Motor and sensory function extremities normal. Wound clean and dry. Chest and abdomen benign. Disposition: 01-Home or Self Care   Allergies as of 04/14/2017      Reactions   Amoxicillin Nausea And Vomiting   Hydrocodone Itching, Nausea And Vomiting      Medication List    STOP taking these medications   oxyCODONE-acetaminophen 5-325 MG tablet Commonly known as:  PERCOCET/ROXICET     TAKE these medications   albuterol 108 (90 Base) MCG/ACT inhaler Commonly known as:  PROVENTIL HFA;VENTOLIN HFA Inhale 2 puffs into the lungs every 6 (six) hours as needed for wheezing or shortness of breath.   ALPRAZolam 0.5 MG tablet Commonly known as:  XANAX Take 0.5 mg by mouth 2 (two) times daily.   buPROPion 150 MG 24 hr tablet Commonly known as:  WELLBUTRIN XL Take 150 mg by mouth every morning.   diazepam 5 MG tablet Commonly known as:  VALIUM Take 1-2 tablets (5-10 mg total) by mouth every 6 (six) hours as needed for muscle spasms.   Diclofenac Sodium 3 % Gel Apply 2 g  topically 2 (two) times daily.   gabapentin 600 MG tablet Commonly known as:  NEURONTIN Take 600 mg by mouth 4 (four) times daily.   HYDROmorphone 2 MG tablet Commonly known as:  DILAUDID Take 1-2 tablets (2-4 mg total) by mouth every 4 (four) hours as needed for severe pain.   ibuprofen 200 MG tablet Commonly known as:  ADVIL,MOTRIN Take 200 mg by mouth every 6 (six) hours as needed for headache.   ondansetron 4 MG disintegrating tablet Commonly known as:  ZOFRAN-ODT Take 4 mg by mouth as needed. FOR NAUSEA/VOMITING   QUEtiapine 100 MG tablet Commonly known as:  SEROQUEL Take 200 mg by mouth at bedtime.            Durable Medical Equipment        Start     Ordered   04/12/17 1457  For home use only DME Tub bench  Once     04/12/17 1457   04/11/17 1755  DME Walker rolling  Once    Question:  Patient needs a walker to treat with the following condition  Answer:  Spondylolisthesis at L5-S1 level   04/11/17 1754   04/11/17 1755  DME 3 n 1  Once     04/11/17 1754       Discharge Care Instructions        Start     Ordered   04/14/17 0000  diazepam (VALIUM) 5 MG tablet  Every 6 hours PRN     04/14/17 0916   04/14/17 0000  HYDROmorphone (DILAUDID) 2 MG tablet  Every 4  hours PRN     04/14/17 0916       Signed: Layonna Dobie A 04/14/2017, 9:16 AM

## 2017-04-14 NOTE — Anesthesia Postprocedure Evaluation (Signed)
Anesthesia Post Note  Patient: Cathy PoeBarbara E Santos  Procedure(s) Performed: Procedure(s) (LRB): POSTERIOR LUMBAR INTERBODY FUSION  - LUMBAR FOUR-FIVE, LUMBAR FIVE-SACRUM ONE (N/A)     Patient location during evaluation: PACU Anesthesia Type: General Level of consciousness: awake and alert Pain management: pain level controlled Vital Signs Assessment: post-procedure vital signs reviewed and stable Respiratory status: spontaneous breathing, nonlabored ventilation, respiratory function stable and patient connected to nasal cannula oxygen Cardiovascular status: blood pressure returned to baseline and stable Postop Assessment: no signs of nausea or vomiting Anesthetic complications: no    Last Vitals:  Vitals:   04/14/17 0013 04/14/17 0342  BP: (!) 88/45 130/82  Pulse: (!) 109 (!) 109  Resp: 18 18  Temp: 37.6 C 36.6 C  SpO2: 96% 100%    Last Pain:  Vitals:   04/14/17 0456  TempSrc:   PainSc: Asleep                 Braiden Presutti S

## 2021-04-23 ENCOUNTER — Encounter: Payer: No Typology Code available for payment source | Admitting: Obstetrics and Gynecology

## 2021-06-22 ENCOUNTER — Other Ambulatory Visit: Payer: Self-pay | Admitting: Family Medicine

## 2021-06-22 DIAGNOSIS — Z1231 Encounter for screening mammogram for malignant neoplasm of breast: Secondary | ICD-10-CM

## 2021-07-01 ENCOUNTER — Encounter: Payer: No Typology Code available for payment source | Admitting: Obstetrics and Gynecology

## 2021-07-02 ENCOUNTER — Encounter: Payer: No Typology Code available for payment source | Admitting: Obstetrics and Gynecology

## 2021-12-15 ENCOUNTER — Ambulatory Visit: Payer: Medicaid Other

## 2022-01-14 ENCOUNTER — Telehealth: Payer: Self-pay

## 2022-01-19 ENCOUNTER — Ambulatory Visit: Payer: Medicaid Other | Admitting: Obstetrics and Gynecology

## 2022-06-22 NOTE — Telephone Encounter (Signed)
CALLED TO CONFIRM APPT ON 01/19/22

## 2023-02-10 ENCOUNTER — Encounter (HOSPITAL_COMMUNITY): Payer: Self-pay | Admitting: Emergency Medicine

## 2023-02-10 ENCOUNTER — Other Ambulatory Visit: Payer: Self-pay

## 2023-02-10 ENCOUNTER — Ambulatory Visit (HOSPITAL_COMMUNITY)
Admission: EM | Admit: 2023-02-10 | Discharge: 2023-02-10 | Disposition: A | Discharge status: Home / Self Care | Payer: Medicaid Other | Attending: Urgent Care | Admitting: Urgent Care

## 2023-02-10 DIAGNOSIS — L0502 Pilonidal sinus with abscess: Secondary | ICD-10-CM | POA: Diagnosis present

## 2023-02-10 LAB — AEROBIC/ANAEROBIC CULTURE W GRAM STAIN (SURGICAL/DEEP WOUND)

## 2023-02-10 MED ORDER — MUPIROCIN 2 % EX OINT: 1.0000 | TOPICAL_OINTMENT | Freq: Three times a day (TID) | CUTANEOUS | 0 refills | Status: AC

## 2023-02-10 MED ORDER — CLINDAMYCIN HCL 300 MG PO CAPS: 300.0000 mg | ORAL_CAPSULE | Freq: Three times a day (TID) | ORAL | 0 refills | Status: AC

## 2023-02-10 NOTE — ED Provider Notes (Signed)
[facility]    CSN: [id number] Arrival date & time: 02/10/23  1910      History   Chief Complaint Chief Complaint  Patient presents with   Rash    HPI Cathy Santos is a 47 y.o. female.   Pleasant 47 year old female presents today due to concerns of a painful rash to her gluteal cleft/buttocks region.  She states has been present for the past 5 days.  States one of her family members who has a known history of MRSA recently came and stayed at her house.  She is concerned that he might of used her loofah in the shower as symptoms started shortly after he left.  She reports pain to the midline gluteal cleft and on the right butt cheek.  She reports numerous pustules.  She saw her PCP yesterday who gave her a topical cream but this was not covered therefore she did not pick it up.  Patient denies fever or systemic symptoms.  Some of the areas have been draining.   Rash   Past Medical History:  Diagnosis Date   Asthma    Fibromyalgia    Hep C w/o coma, chronic (HCC)    Lumbar back pain    Spondylolisthesis of lumbar region    Ulcer     Patient Active Problem List   Diagnosis Date Noted   Spondylolisthesis at L5-S1 level 04/11/2017   Postop check 08/24/2011   Abdominal pain, other specified site 06/30/2011   Gallbladder & bile duct stone with obstruction 06/30/2011   Hepatitis C 06/30/2011   Back pain 06/30/2011    Past Surgical History:  Procedure Laterality Date   CHOLECYSTECTOMY  07/03/2011   Procedure: LAPAROSCOPIC CHOLECYSTECTOMY;  Surgeon: Jetty Duhamel, MD;  Location: [facility] OR;  Service: General;  Laterality: N/A;   ERCP  07/02/2011   Procedure: ENDOSCOPIC RETROGRADE CHOLANGIOPANCREATOGRAPHY (ERCP);  Surgeon: Iva Boop, MD;  Location: [facility] OR;  Service: Gastroenterology;  Laterality: N/A;   TUBAL LIGATION      OB History   No obstetric history on file.      Home Medications    Prior to Admission medications   Medication Sig Start Date  End Date Taking? Authorizing Provider  buprenorphine-naloxone (SUBOXONE) 8-2 mg SUBL SL tablet Place 1 tablet under the tongue 3 (three) times daily. 01/10/23  Yes [provider]  clindamycin (CLEOCIN) 300 MG capsule Take 1 capsule (300 mg total) by mouth 3 (three) times daily for 7 days. 02/10/23 02/17/23 Yes Kerry Chisolm L, PA  mupirocin ointment (BACTROBAN) 2 % Apply 1 Application topically 3 (three) times daily. 02/10/23  Yes Solash Tullo L, PA  albuterol (PROVENTIL HFA;VENTOLIN HFA) 108 (90 BASE) MCG/ACT inhaler Inhale 2 puffs into the lungs every 6 (six) hours as needed for wheezing or shortness of breath.    [provider]  ALPRAZolam Prudy Feeler) 0.5 MG tablet Take 0.5 mg by mouth 2 (two) times daily.    [provider]  buPROPion (WELLBUTRIN XL) 150 MG 24 hr tablet Take 150 mg by mouth every morning. 03/24/17   [provider]  diazepam (VALIUM) 5 MG tablet Take 1-2 tablets (5-10 mg total) by mouth every 6 (six) hours as needed for muscle spasms. Patient not taking: Reported on 02/10/2023 04/14/17   Julio Sicks, MD  Diclofenac Sodium 3 % GEL Apply 2 g topically 2 (two) times daily. 02/14/17   [provider]  gabapentin (NEURONTIN) 600 MG tablet Take 600 mg by mouth 4 (  four) times daily.  10/23/14   [provider]  HYDROmorphone (DILAUDID) 2 MG tablet Take 1-2 tablets (2-4 mg total) by mouth every 4 (four) hours as needed for severe pain. Patient not taking: Reported on 02/10/2023 04/14/17   Julio Sicks, MD  ibuprofen (ADVIL,MOTRIN) 200 MG tablet Take 200 mg by mouth every 6 (six) hours as needed for headache.    [provider]  ondansetron (ZOFRAN-ODT) 4 MG disintegrating tablet Take 4 mg by mouth as needed. FOR NAUSEA/VOMITING 12/11/14   [provider]  QUEtiapine (SEROQUEL) 100 MG tablet Take 200 mg by mouth at bedtime. Patient not taking: Reported on 02/10/2023 03/24/17   [provider]  dicyclomine (BENTYL) 20 MG tablet  Take 1 tablet (20 mg total) by mouth 2 (two) times daily. 02/26/15 08/19/15  Pisciotta, Joni Reining, PA-C  famotidine (PEPCID) 20 MG tablet Take 1 tablet (20 mg total) by mouth 2 (two) times daily. 02/26/15 08/19/15  Pisciotta, Joni Reining, PA-C  omeprazole (PRILOSEC) 40 MG capsule Take 40 mg by mouth 2 (two) times daily. 12/11/14 08/19/15  [provider]  promethazine (PHENERGAN) 25 MG suppository Place 1 suppository (25 mg total) rectally every 8 (eight) hours as needed for nausea or vomiting. 11/19/14 08/19/15  Ladona Mow, PA-C    Family History Family History  Problem Relation Age of Onset   Cancer Mother    Diabetes Father    Heart failure Father    Hypertension Father    Stroke Father     Social History Social History   Tobacco Use   Smoking status: Every Day    Packs/day: 1    Types: Cigarettes   Smokeless tobacco: Never  Vaping Use   Vaping Use: Former  Substance Use Topics   Alcohol use: No   Drug use: Yes    Types: Marijuana     Allergies   Amoxicillin and Hydrocodone   Review of Systems Review of Systems  Skin:  Positive for rash.  As per HPI   Physical Exam Triage Vital Signs ED Triage Vitals  Enc Vitals Group     BP 02/10/23 1931 107/76     Pulse Rate 02/10/23 1931 (!) 107     Resp 02/10/23 1931 20     Temp 02/10/23 1931 98.7 F (37.1 C)     Temp Source 02/10/23 1931 Oral     SpO2 02/10/23 1931 94 %     Weight --      Height --      Head Circumference --      Peak Flow --      Pain Score 02/10/23 1927 8     Pain Loc --      Pain Edu? --      Excl. in GC? --    No data found.  Updated Vital Signs BP 107/76 (BP Location: Right Arm)   Pulse (!) 107   Temp 98.7 F (37.1 C) (Oral)   Resp 20   LMP 02/07/2017 (Approximate)   SpO2 94%   Visual Acuity Right Eye Distance:   Left Eye Distance:   Bilateral Distance:    Right Eye Near:   Left Eye Near:    Bilateral Near:     Physical Exam Vitals and nursing note reviewed. Exam conducted with a  chaperone present.  Constitutional:      General: She is not in acute distress.    Appearance: Normal appearance. She is not ill-appearing, toxic-appearing or diaphoretic.     Comments: Uncomfortable, pacing  around room  HENT:     Head: Normocephalic and atraumatic.  Cardiovascular:     Rate and Rhythm: Tachycardia present.  Pulmonary:     Effort: Pulmonary effort is normal. No respiratory distress.  Genitourinary:   Musculoskeletal:       Back:  Skin:    Findings: Rash present. Rash is pustular (to R buttock medially).     Comments: Pilonidal sinus tract with purulent discharge, suspected ruptured abscess  Neurological:     Mental Status: She is alert.      UC Treatments / Results  Labs (all labs ordered are listed, but only abnormal results are displayed) Labs Reviewed  AEROBIC/ANAEROBIC CULTURE W GRAM STAIN (SURGICAL/DEEP WOUND)    EKG   Radiology No results found.  Procedures Procedures (including critical care time)  Medications Ordered in UC Medications - No data to display  Initial Impression / Assessment and Plan / UC Course  I have reviewed the triage vital signs and the nursing notes.  Pertinent labs & imaging results that were available during my care of the patient were reviewed by me and considered in my medical decision making (see chart for details).     Pilonidal with sinus tract and draining abscess - sx consistent with bacterial infection of the skin secondary to draining pilonidal. Will start clindamycin as pt has allergy to PCNs. (C. Diff risk reviewed with pt/ husband). Will also have pt use topical mupirocin to cover for possible MRSA given exposure. Aerobic/ anaerobic culture obtained. Referral to gen surg for possible excision discussed as suspected sinus tract appears extensive.    Final Clinical Impressions(s) / UC Diagnoses   Final diagnoses:  Pilonidal sinus with abscess     Discharge Instructions      You appear to have an  infected pilonidal sinus.  Please apply topical mupirocin 3 times daily. Take the antibiotic orally 3 times daily. This medication does have a high risk of causing diarrhea, so please take with food.  Eat yogurt daily to prevent adverse GI side effects and purchase over-the-counter probiotic such as Florastor or Culturelle.  If you do develop severe, profuse diarrhea, please contact our office. If your symptoms persist or worsen, please call the general surgeon listed on this form for a further evaluation and management. We will only call you with the results of your wound swab if a change in treatment is indicated.     ED Prescriptions     Medication Sig Dispense Auth. Provider   clindamycin (CLEOCIN) 300 MG capsule Take 1 capsule (300 mg total) by mouth 3 (three) times daily for 7 days. 21 capsule Gisela Lea L, PA   mupirocin ointment (BACTROBAN) 2 % Apply 1 Application topically 3 (three) times daily. 22 g Cambelle Suchecki L, [place]      PDMP not reviewed this encounter.   Maretta Bees, [place] 02/10/23 2046

## 2023-02-10 NOTE — ED Triage Notes (Signed)
Redness and moist skin between buttocks.  There are a few bumps with white tops to the bump.  Patient has been dealing with this for a week.  Patient has been using an otc ointment -do not know the name of this.  Saw pcp yesterday, but medicine prescribed is not covered by [facility]

## 2023-02-10 NOTE — Discharge Instructions (Signed)
You appear to have an infected pilonidal sinus.  Please apply topical mupirocin 3 times daily. Take the antibiotic orally 3 times daily. This medication does have a high risk of causing diarrhea, so please take with food.  Eat yogurt daily to prevent adverse GI side effects and purchase over-the-counter probiotic such as Florastor or Culturelle.  If you do develop severe, profuse diarrhea, please contact our office. If your symptoms persist or worsen, please call the general surgeon listed on this form for a further evaluation and management. We will only call you with the results of your wound swab if a change in treatment is indicated.

## 2023-02-11 LAB — AEROBIC/ANAEROBIC CULTURE W GRAM STAIN (SURGICAL/DEEP WOUND)

## 2023-02-12 LAB — AEROBIC/ANAEROBIC CULTURE W GRAM STAIN (SURGICAL/DEEP WOUND)

## 2023-02-14 LAB — AEROBIC/ANAEROBIC CULTURE W GRAM STAIN (SURGICAL/DEEP WOUND)
